# Patient Record
Sex: Female | Born: 1950 | Race: Black or African American | Hispanic: No | Smoking: Former smoker
Health system: Southern US, Community
[De-identification: ages and names within clinical notes are randomized; demographics above are authoritative.]

## PROBLEM LIST (undated history)

## (undated) DIAGNOSIS — Z9289 Personal history of other medical treatment: Secondary | ICD-10-CM

## (undated) DIAGNOSIS — Z5189 Encounter for other specified aftercare: Secondary | ICD-10-CM

## (undated) DIAGNOSIS — H269 Unspecified cataract: Secondary | ICD-10-CM

## (undated) DIAGNOSIS — M1990 Unspecified osteoarthritis, unspecified site: Secondary | ICD-10-CM

## (undated) DIAGNOSIS — Z87898 Personal history of other specified conditions: Secondary | ICD-10-CM

## (undated) DIAGNOSIS — D219 Benign neoplasm of connective and other soft tissue, unspecified: Secondary | ICD-10-CM

## (undated) DIAGNOSIS — Z9989 Dependence on other enabling machines and devices: Secondary | ICD-10-CM

## (undated) DIAGNOSIS — R011 Cardiac murmur, unspecified: Secondary | ICD-10-CM

## (undated) DIAGNOSIS — I10 Essential (primary) hypertension: Secondary | ICD-10-CM

## (undated) DIAGNOSIS — G4733 Obstructive sleep apnea (adult) (pediatric): Secondary | ICD-10-CM

## (undated) DIAGNOSIS — K5792 Diverticulitis of intestine, part unspecified, without perforation or abscess without bleeding: Secondary | ICD-10-CM

## (undated) DIAGNOSIS — K219 Gastro-esophageal reflux disease without esophagitis: Secondary | ICD-10-CM

## (undated) DIAGNOSIS — E785 Hyperlipidemia, unspecified: Secondary | ICD-10-CM

## (undated) HISTORY — DX: Dependence on other enabling machines and devices: Z99.89

## (undated) HISTORY — DX: Personal history of other specified conditions: Z87.898

## (undated) HISTORY — DX: Diverticulitis of intestine, part unspecified, without perforation or abscess without bleeding: K57.92

## (undated) HISTORY — PX: OOPHORECTOMY: SHX86

## (undated) HISTORY — DX: Encounter for other specified aftercare: Z51.89

## (undated) HISTORY — PX: JOINT REPLACEMENT: SHX530

## (undated) HISTORY — DX: Hyperlipidemia, unspecified: E78.5

## (undated) HISTORY — DX: Personal history of other medical treatment: Z92.89

## (undated) HISTORY — PX: BREAST SURGERY: SHX581

## (undated) HISTORY — DX: Benign neoplasm of connective and other soft tissue, unspecified: D21.9

## (undated) HISTORY — PX: HERNIA REPAIR: SHX51

## (undated) HISTORY — PX: TONSILLECTOMY: SHX5217

## (undated) HISTORY — DX: Essential (primary) hypertension: I10

## (undated) HISTORY — DX: Obstructive sleep apnea (adult) (pediatric): G47.33

## (undated) HISTORY — PX: EYE SURGERY: SHX253

## (undated) HISTORY — DX: Unspecified cataract: H26.9

---

## 1989-11-26 HISTORY — PX: ABDOMINAL HYSTERECTOMY: SHX81

## 1998-05-11 ENCOUNTER — Other Ambulatory Visit: Admission: RE | Admit: 1998-05-11 | Discharge: 1998-05-11 | Payer: Self-pay | Admitting: Obstetrics and Gynecology

## 1999-03-24 ENCOUNTER — Other Ambulatory Visit: Admission: RE | Admit: 1999-03-24 | Discharge: 1999-03-24 | Payer: Self-pay | Admitting: Obstetrics and Gynecology

## 1999-10-23 ENCOUNTER — Encounter (INDEPENDENT_AMBULATORY_CARE_PROVIDER_SITE_OTHER): Payer: Self-pay | Admitting: *Deleted

## 1999-10-23 ENCOUNTER — Ambulatory Visit (HOSPITAL_BASED_OUTPATIENT_CLINIC_OR_DEPARTMENT_OTHER): Admission: RE | Admit: 1999-10-23 | Discharge: 1999-10-24 | Payer: Self-pay | Admitting: Specialist

## 2000-04-18 ENCOUNTER — Other Ambulatory Visit: Admission: RE | Admit: 2000-04-18 | Discharge: 2000-04-18 | Payer: Self-pay | Admitting: Obstetrics and Gynecology

## 2001-03-15 ENCOUNTER — Encounter: Payer: Self-pay | Admitting: Emergency Medicine

## 2001-03-15 ENCOUNTER — Emergency Department (HOSPITAL_COMMUNITY): Admission: EM | Admit: 2001-03-15 | Discharge: 2001-03-15 | Payer: Self-pay | Admitting: Emergency Medicine

## 2001-04-23 ENCOUNTER — Other Ambulatory Visit: Admission: RE | Admit: 2001-04-23 | Discharge: 2001-04-23 | Payer: Self-pay | Admitting: Obstetrics and Gynecology

## 2002-03-06 ENCOUNTER — Other Ambulatory Visit: Admission: RE | Admit: 2002-03-06 | Discharge: 2002-03-06 | Payer: Self-pay | Admitting: Obstetrics and Gynecology

## 2003-03-08 ENCOUNTER — Other Ambulatory Visit: Admission: RE | Admit: 2003-03-08 | Discharge: 2003-03-08 | Payer: Self-pay | Admitting: Obstetrics and Gynecology

## 2004-03-20 ENCOUNTER — Other Ambulatory Visit: Admission: RE | Admit: 2004-03-20 | Discharge: 2004-03-20 | Payer: Self-pay | Admitting: Obstetrics and Gynecology

## 2005-01-09 ENCOUNTER — Ambulatory Visit: Payer: Self-pay | Admitting: Gastroenterology

## 2005-01-24 ENCOUNTER — Ambulatory Visit: Payer: Self-pay | Admitting: Gastroenterology

## 2005-02-12 ENCOUNTER — Ambulatory Visit: Payer: Self-pay | Admitting: Gastroenterology

## 2005-03-21 ENCOUNTER — Other Ambulatory Visit: Admission: RE | Admit: 2005-03-21 | Discharge: 2005-03-21 | Payer: Self-pay | Admitting: Obstetrics and Gynecology

## 2005-07-11 ENCOUNTER — Ambulatory Visit (HOSPITAL_COMMUNITY): Admission: RE | Admit: 2005-07-11 | Discharge: 2005-07-11 | Payer: Self-pay | Admitting: General Surgery

## 2005-07-11 ENCOUNTER — Encounter (INDEPENDENT_AMBULATORY_CARE_PROVIDER_SITE_OTHER): Payer: Self-pay | Admitting: *Deleted

## 2006-01-10 ENCOUNTER — Encounter: Admission: RE | Admit: 2006-01-10 | Discharge: 2006-01-10 | Payer: Self-pay | Admitting: General Surgery

## 2006-04-19 ENCOUNTER — Other Ambulatory Visit: Admission: RE | Admit: 2006-04-19 | Discharge: 2006-04-19 | Payer: Self-pay | Admitting: Obstetrics and Gynecology

## 2006-11-26 HISTORY — PX: REPLACEMENT TOTAL KNEE: SUR1224

## 2007-04-22 ENCOUNTER — Other Ambulatory Visit: Admission: RE | Admit: 2007-04-22 | Discharge: 2007-04-22 | Payer: Self-pay | Admitting: Obstetrics and Gynecology

## 2007-08-12 ENCOUNTER — Inpatient Hospital Stay (HOSPITAL_COMMUNITY): Admission: RE | Admit: 2007-08-12 | Discharge: 2007-08-15 | Payer: Self-pay | Admitting: Orthopedic Surgery

## 2008-04-28 ENCOUNTER — Other Ambulatory Visit: Admission: RE | Admit: 2008-04-28 | Discharge: 2008-04-28 | Payer: Self-pay | Admitting: Obstetrics and Gynecology

## 2008-08-05 ENCOUNTER — Ambulatory Visit (HOSPITAL_COMMUNITY): Admission: RE | Admit: 2008-08-05 | Discharge: 2008-08-05 | Payer: Self-pay | Admitting: Ophthalmology

## 2008-11-26 HISTORY — PX: EYE SURGERY: SHX253

## 2009-05-18 ENCOUNTER — Encounter: Payer: Self-pay | Admitting: Obstetrics and Gynecology

## 2009-05-18 ENCOUNTER — Other Ambulatory Visit: Admission: RE | Admit: 2009-05-18 | Discharge: 2009-05-18 | Payer: Self-pay | Admitting: Obstetrics and Gynecology

## 2009-05-18 ENCOUNTER — Ambulatory Visit: Payer: Self-pay | Admitting: Obstetrics and Gynecology

## 2010-07-21 ENCOUNTER — Other Ambulatory Visit: Admission: RE | Admit: 2010-07-21 | Discharge: 2010-07-21 | Payer: Self-pay | Admitting: Obstetrics and Gynecology

## 2010-07-21 ENCOUNTER — Ambulatory Visit: Payer: Self-pay | Admitting: Obstetrics and Gynecology

## 2010-08-22 ENCOUNTER — Ambulatory Visit: Payer: Self-pay | Admitting: Obstetrics and Gynecology

## 2010-09-19 ENCOUNTER — Ambulatory Visit: Payer: Self-pay | Admitting: Obstetrics and Gynecology

## 2010-11-23 ENCOUNTER — Ambulatory Visit (HOSPITAL_COMMUNITY)
Admission: RE | Admit: 2010-11-23 | Discharge: 2010-11-24 | Payer: Self-pay | Source: Home / Self Care | Attending: Ophthalmology | Admitting: Ophthalmology

## 2011-02-05 LAB — BASIC METABOLIC PANEL
BUN: 9 mg/dL (ref 6–23)
Calcium: 10.2 mg/dL (ref 8.4–10.5)
Creatinine, Ser: 0.91 mg/dL (ref 0.4–1.2)
GFR calc non Af Amer: >60 mL/min (ref >60)
Glucose, Bld: 100 mg/dL — ABNORMAL HIGH (ref 70–99)

## 2011-02-05 LAB — CBC
HCT: 41.1 % (ref 36.0–46.0)
MCHC: 31.6 g/dL (ref 30.0–36.0)
Platelets: 208 10*3/uL (ref 150–400)
RDW: 12.5 % (ref 11.5–15.5)

## 2011-02-05 LAB — SURGICAL PCR SCREEN
MRSA, PCR: NEGATIVE
Staphylococcus aureus: NEGATIVE

## 2011-02-27 NOTE — Op Note (Signed)
[name], BENNING NO.:  [id number]  MEDICAL RECORD NO.:  [id number]          PATIENT TYPE:  INP  LOCATION:  5508                         FACILITY:  [facility]  PHYSICIAN:  Chalmers Guest, M.D.     DATE OF BIRTH:  Apr 25, 1951  DATE OF PROCEDURE:  11/23/2010 DATE OF DISCHARGE:                              OPERATIVE REPORT   PREOPERATIVE DIAGNOSIS:  Uncontrolled primary open-angle glaucoma with preexisting scarring from previous glaucoma surgery.  PROCEDURE:  Trabeculectomy with mitomycin-C.  COMPLICATIONS:  None.  ANESTHESIA:  General.  PROCEDURE:  The patient was taken to the operating room where after induction of general anesthesia, the patient's face was prepped and draped in the usual sterile fashion.  Following this with the surgeon sitting superiorly, a 6-0 nylon suture was passed through clear cornea to infraduct the eye.  With the eye in intraductal position, the Hoskin was used to grasp the conjunctiva superotemporally because there was scarring at the 12 o'clock in nasal position.  A Westcott scissors were used to make an incision in the conjunctiva and dissection was carried out.  Following this using a blunt Westcott scissor, dissection was carried out posteriorly using blunt dissection.  Bleeding was controlled with cautery.  A tooke blade was used to recess Tenon fibers.  Following this, a 45-degree blade was used to fashion a half-thickness scleral flap.  A Colibri forceps and a #5700 blade was used to dissect up to the corneoscleral limbus with the limbal tissues identified.  Additional cautery was applied.  Following this, a Gelfoam sponge that had been presoaked on mitomycin-C 0.4 mg per mL was placed under the conjunctiva and allowed to stay on the eye for 4 minutes and then removed and irrigated with 60 mL of balanced salt solution.  At this point, a paracentesis was formed at the 7 o'clock position.  Then, the scleral flap was elevated and  a 45-degree blade was used to enter under the scleral flap into the anterior chamber.  A Descemet punch was used to remove a 1/3 block of tissue.  Bleeding was controlled with cautery.  A basilar peripheral iridectomy was performed and 2 interrupted 10-0 nylon sutures were placed.  BSS was injected through the paracentesis tract into the anterior chamber.  The chamber deepened and there was minimal flow around the scleral flap.  Therefore, the conjunctiva was sutured with a 9-0 nylon on a BV 100 needle at the limbus and secured in the center as well.  After tight closure was achieved, BSS was again injected in the anterior chamber.  The chamber deepened.  The bleb elevated.  The incision was Seidel negative.  A subconjunctival injection of Kenalog 4 mg was given in the inferotemporal quadrant. Following this, all instrumentations were removed from the eye.  The fixation suture was removed.  Topical atropine ointment and topical TobraDex ointment was applied to the eye, patch and Fox shield were placed, and the patient returned to the recovery area in stable condition.     Chalmers Guest, M.D.     RW/MEDQ  D:  11/23/2010  T:  11/24/2010  Job:  [id number]  Electronically  Signed by Chalmers Guest M.D. on 02/26/2011 01:43:08 PM

## 2011-04-10 NOTE — Op Note (Signed)
[name], [name]             ACCOUNT NO.:  [id number]   MEDICAL RECORD NO.:  [id number]          PATIENT TYPE:  AMB   LOCATION:  SDS                          FACILITY:  [facility]   PHYSICIAN:  Chalmers Guest, M.D.     DATE OF BIRTH:  01-04-1951   DATE OF PROCEDURE:  08/05/2008  DATE OF DISCHARGE:                               OPERATIVE REPORT   PREOPERATIVE DIAGNOSIS:  Uncontrolled primary open angle glaucoma, right  eye.   POSTOPERATIVE DIAGNOSIS:  Uncontrolled primary open angle glaucoma,  right eye.   PROCEDURE:  Optonol ExPRESS mini shunt with mitomycin C using a scleral  flap, right eye.   ANESTHESIA:  Consisted of 2% Xylocaine of 50:50 mixture of 0.75%  Marcaine with epinephrine with an ampule of Wydase.   PROCEDURE:  The patient was transferred to the operating room and under  monitored anesthesia, the patient was given a peribulbar block with the  aforementioned local anesthetic agent.  Following this, the patient's  face was prepped and draped in usual sterile fashion with the surgeon  sitting superiorly.  A 6-0 nylon suture was passed through the clear  cornea to infraduct the eye.  With the eye in the infraducted position,  a Hoskins forceps was used to grasp the iris in the superior nasal  quadrant and a Westcott scissors was used to incise the conjunctiva at  the limbus.  A blunt Westcott scissors was used to form a fornix-based  conjunctival flap with careful posterior dissection and posterior  spreading of this under the subconjunctival space with the scissors.  Following this, a Weck-cel was used to retract the conjunctiva and Tooke  blade was used to retract the Tenon tissue.  Bleeding was controlled  with cautery.  A 45-degree blade was used to fashion a half-thickness  scleral flap with a base of 4 mm.  A Colibri forceps was used and a  Grieshaber 5700 blade was used to dissect the scleral flap to the  corneoscleral limbus.  A Gelfoam soaked in mitomycin C 0.4  mg/mL was  placed under the conjunctiva and under the sclera and allowed to stay on  the eye for 2 minutes and then removed.  It was irrigated with 60 mL of  balanced salt solution.  Following this, a paracentesis was formed at  the 8:30 position and Provisc was injected in the eye.  The scleral flap  was then elevated and a 26 gauge needle was passed under the scleral  flap and into the anterior chamber at the corneal limbal junction.  The  needle was withdrawn and the shunt was then examined and loosened.  It  took some time to get the shunt loose from the apparatus.  It was a  preloaded ExPRESS shunt T50, model [id number].  The shunt was then  rotated and passed into the anterior chamber and locked into position.  Four interrupted 10-0 nylon sutures were placed to secure the scleral  flap.  The conjunctiva was then closed with a 9-0 nylon on a BV100  needle.  It was necessary to secure the conjunctival sutures at the  limbus.  BSS was injected to burp out the viscoelastic out of the  anterior chamber.  The bleb elevated.  An additional suture was placed  to achieve watertight closure.  Additional BSS was injected in the  anterior chamber and the incision was then Seidel negative.  A  subconjunctival  injection of Kenalog 4 mg was given at the 7 o'clock position.  Following this, topical TobraDex ointment was placed on the eye.  A  patch and Fox shield were placed and the patient returned to recovery  area in stable condition.  Complications were none.      Chalmers Guest, M.D.  Electronically Signed     RW/MEDQ  D:  08/05/2008  T:  08/05/2008  Job:  [id number]   cc:   Fax #:  [phone]

## 2011-04-10 NOTE — Op Note (Signed)
NAMEDARCELLA, SHIFFMAN             ACCOUNT NO.:  [id number]   MEDICAL RECORD NO.:  [id number]          PATIENT TYPE:  INP   LOCATION:  0006                         FACILITY:  [facility]   PHYSICIAN:  Madlyn Frankel. Charlann Boxer, M.D.  DATE OF BIRTH:  1951-07-29   DATE OF PROCEDURE:  08/12/2007  DATE OF DISCHARGE:                               OPERATIVE REPORT   PREOPERATIVE DIAGNOSIS:  Right knee end-stage osteoarthritis.   POSTOPERATIVE DIAGNOSIS:  Right knee end-stage osteoarthritis.   PROCEDURE PERFORMED:  Right total knee replacement.   COMPONENTS USED:  DuPuy knee system, rotating platform, posterior  stabilized, with size 2-1/2 femur, 2-1/2 tibia, 15-mm insert, and a 35  patellar button.   SURGEON:  Madlyn Frankel. Charlann Boxer, M.D.   ASSISTANT:  [name]. Loreta Ave, PA-C.   ANESTHESIA:  Spinal.   DRAINS:  x1.   TOURNIQUET TIME:  41 minutes at 250 mmHg.   COMPLICATIONS:  None.   INDICATION FOR PROCEDURE:  The patient is a 60 year old female, with end-  stage right knee osteoarthritis.  She had failed conservative measures  with injections and viscous supplementation.  At this point, at  explanation of the possible complications and explanation of the  procedure above, the patient wishes to proceed with surgical  intervention.  We discussed the benefits of pain relief and improved  quality of life versus the risk of infection, deep venous thrombosis,  component failure, need for revision, as well as the extensive  postoperative therapy which is required.  Consent was obtained.   PROCEDURE IN DETAIL:  The patient was brought to the operative theater.  Once adequate anesthesia and preoperative antibiotics, Ancef,  administered, the patient was positioned supine.  The right proximal  thigh tourniquet was placed.  The right leg was then pre-scrubbed and  then prepped and draped in sterile fashion.  A midline incision was  made, followed by a modified medial parapatellar arthrotomy with no  subluxation.  Next, following initial exposure, including synovectomy  due to the large synovial response, a patellar cut was made.  Pre-cut  measurements of 22 mm and now was taken down to 14 mm and using 35  patellar button, restoring height to 23 mm to 24 mm.  Attention was now  directed to the femur.  The canal was opened and irrigated, inside  emboli.  Intramedullary rod was packed, with 5 degrees of valgus on the  right knee I resected 10 mm of plantar to distal femur and then sized  the distal femur to be a size 2.5, and, based on the fact that she had  more lateral disease, she had a rather hyperplastic femoral condyle.  A  distal cut was made.   Unfortunately, with the hyperplastic lateral femur, there was some  neutral slight internal rotation of her femoral component.  I did on the  lateral side drop down my pinhole to make the cutting block  perpendicular to the Whiteside's lines and more equal to the epicondylar  line.  The anterior, posterior, and chamfer cuts were then made without  complication of notching.  We then finished off the femoral preparation  with box cut based on the lateral aspect of the distal femur.   The patient was then, the attention was now directed to the tibia.  Subluxation was carried out using the extramedullary device and I  resected 8 mm of bone out from the lateral side of the space and the  fact that the tibia appeared to be neutral and an axis of 6-mm cut  medially, and this cut was made and debridement was carried out as  necessary, including further meniscectomy with instillation of it around  the posterior cruciate ligament.  Extension block came out to at least  full extension of the 10-mm block and was plugged in the knee  symptomatic at 12.5 to 15.  At this point, I went ahead and finished off  the tibia and placed it size 2 tibial tray on the cut surface, passing  alignment rod after the cut was perpendicular and by planes.  I then   drilled and keel punched.  A trial reduction was carried out with a 2.5  femur and 2.5 tibia, the first a 12.5 and then a 15-mm poly.  The knee  came to full extension and was stable from extension to flexion.  At  this point, and the patella tracked without application of knee pressure  and into the trochlea.   At this point, all trial components were removed.  The knee was  copiously irrigated with normal saline solution.  The knee was also  injected in the synovial layer with 0.25% Marcaine and epinephrine with  160 of Toradol.   At this point, the cement was mixed.  The final components were then  cemented in position.  He had the knee was going out to extension.  The  protruding cement was removed.  Once the patient continued to cure, the  knee was irrigated again, I placed the final 15 x 2.5 poly.  At this  point, the knee was again irrigated.  A medium Hemovac drain was placed.  The knee was brought to flexion and the knee immediately was seen to be  reapproximated with extension nicely.  The remainder was closed with 2-0  Vicryl and running 4-0 Monocryl.  The patient's knee was then cleaned  and dried and dressed sterilely with Steri-Strips.  The wound had a  bulky sterile wrap.  She was then brought to the recovery room in stable  condition.      Madlyn Frankel Charlann Boxer, M.D.  Electronically Signed     MDO/MEDQ  D:  08/12/2007  T:  08/12/2007  Job:  [id number]

## 2011-04-13 NOTE — Op Note (Signed)
NAMECORTNE, [name]             ACCOUNT NO.:  [id number]   MEDICAL RECORD NO.:  [id number]          PATIENT TYPE:  AMB   LOCATION:  DAY                          FACILITY:  [facility]   PHYSICIAN:  Leonie Man, M.D.   DATE OF BIRTH:  1951-08-03   DATE OF PROCEDURE:  07/11/2005  DATE OF DISCHARGE:                                 OPERATIVE REPORT   PREOPERATIVE DIAGNOSIS:  Duct papillomatosis right breast.   POSTOPERATIVE DIAGNOSIS:  Duct papillomatosis right breast.   PROCEDURES:  Right breast major duct excision.   SURGEON:  Leonie Man, M.D.   ASSISTANT:  OR tech.   ANESTHESIA:  General.   SPECIMENS TO THE PATHOLOGY LAB:  Duct system of the right breast.   ESTIMATED BLOOD LOSS:  Minimal.   COMPLICATIONS:  No complications.  The patient to the PACU in good  condition.   HISTORY:  Note, the patient is a 60 year old woman presenting with a right  bloody nipple discharge. She underwent ductogram which showed filling  defects in several of the ducts under the areolar of the right breast. On  expression of the area in the upper inner quadrant of the breast, there is a  copious amount of brownish fluid that is removed. The patient comes to the  operating room after the details of the procedure, the risks and potential  benefits of surgery have been discussed, all questions answered and consent  obtained. It should be noted that this patient has undergone reduction  mammoplasties in the past and the possibility of some skin slough following  duct excision was discussed in detail with the patient.   DESCRIPTION OF PROCEDURE:  Following the induction of satisfactory general  anesthesia, the patient is positioned supinely and the right breast is  prepped and draped to be included in the sterile operative field. I made a  circumareolar incision extending on the medial aspect of the areolar border  extending around approximately 180 degrees of the areola. The areolar skin  was  then elevated as a flap and dissection carried down taking the entire  duct system from the region underneath the nipple. The duct system was then  excised for a distance of 5 cm deep into the breast tissues and this was  removed in its entirety and forwarded for pathologic evaluation. Hemostasis  was obtained with electrocautery, interrupted 2-0 Vicryl sutures were used  to reapproximate the breast tissue. 3-0 Vicryl was used to close the  subcutaneous tissue after all sponge, instrument and sharp counts were  verified. The skin was then  closed with running 5-0 Monocryl suture and then reinforced with Steri-  Strips, sterile dressings were applied, the anesthetic reversed and the  patient removed from the operating room to the recovery room in stable  condition. She tolerated the procedure well.      Leonie Man, M.D.  Electronically Signed     PB/MEDQ  D:  07/11/2005  T:  07/12/2005  Job:  [id number]

## 2011-04-13 NOTE — H&P (Signed)
[name], [name]             ACCOUNT NO.:  [id number]   MEDICAL RECORD NO.:  [id number]        PATIENT TYPE:  LINP   LOCATION:                               FACILITY:  [facility]   PHYSICIAN:  Madlyn Frankel. Charlann Boxer, M.D.  DATE OF BIRTH:  January 03, 1951   DATE OF ADMISSION:  08/12/2007  DATE OF DISCHARGE:                              HISTORY & PHYSICAL   PROCEDURE:  Right total knee arthroplasty.   CHIEF COMPLAINT:  Right knee pain.   HISTORY OF PRESENT ILLNESS:  This is a 60 year old female with a history  persistent progressive right knee pain secondary to osteoarthritis.  It  has been refractory to all conservative treatments included anti-  inflammatories and injections.  She has been presurgically assessed by  Talmadge Coventry, M.D.   PAST MEDICAL HISTORY:  Significant for  1. Osteoarthritis.  2. Hypertension.   PAST SURGICAL HISTORY:  1. Hysterectomy.  2. Breast reduction.   FAMILY HISTORY:  Hypertension, diabetes, cancer, stroke, arthritis.   SOCIAL HISTORY:  She is widowed.  Primary caregiver will be her cousin,  Charma Igo, after surgery.   DRUG ALLERGIES:  NO KNOWN DRUG ALLERGIES.   MEDICATIONS:  1. Hydrochlorothiazide 25 mg half tablet daily.  2. Estradiol 0.5 mg p.o. daily.  3. Vitamin E.  4. Multivitamin.  5. Garlic pill.  6. Over-the-counter eye drops.  7. Celebrex 200 mg p.o. b.i.d. x2 days after surgery.   REVIEW OF SYSTEMS:  GASTROINTESTINAL:  History of hiatal hernia with  some reflux issues.  Otherwise see HPI.   PHYSICAL EXAMINATION:  VITAL SIGNS:  Pulse 64, respirations 18, blood  pressure 124/74.  GENERAL:  Awake, alert and oriented, well-developed, well-nourished, no  acute distress.  NECK:  Supple.  No carotid bruits.  CHEST/LUNGS:  Clear to auscultation bilaterally.  BREASTS:  Deferred.  HEART:  Regular rate and rhythm without gallops, clicks, rubs or  murmurs.  ABDOMEN:  Soft, nontender, nondistended.  Bowel sounds present.  GENITOURINARY:   Deferred.  EXTREMITIES:  She has valgus deformity.  She comes out to full extension  with the 130 degrees of flexion.  SKIN:  Intact.  No cellulitis.  No infection.  NEUROLOGIC:  Intact distal sensibilities.   Labs, EKG, chest x-ray all pending her assessment.   IMPRESSION:  1. Right knee osteoarthritis.  2. Hypertension.   PLAN OF ACTION:  Right total knee arthroplasty August 12, 2007, at  [facility] by Dr. Charlann Boxer.  Risks and complications were  discussed.  Questions were encouraged, answered and reviewed.   POSTOPERATIVE MEDICATIONS:  Lovenox, Robaxin, iron, aspirin, Colace and  MiraLax provided.  Pain medications will be provided at time of surgery.     ______________________________  [name] Loreta Ave, [place]      Madlyn Frankel. Charlann Boxer, M.D.  Electronically Signed    BLM/MEDQ  D:  07/25/2007  T:  07/26/2007  Job:  [id number]   cc:   Talmadge Coventry, M.D.  Fax: [phone]

## 2011-04-13 NOTE — Discharge Summary (Signed)
[name], [name]             ACCOUNT NO.:  [id number]   MEDICAL RECORD NO.:  [id number]          PATIENT TYPE:  INP   LOCATION:  1608                         [facility]ACILITY:  [facility]   PHYSICIAN:  Madlyn Frankel. Charlann Boxer, M.D.  DATE OF BIRTH:  23-Dec-1950   DATE OF ADMISSION:  08/12/2007  DATE OF DISCHARGE:  08/15/2007                               DISCHARGE SUMMARY   ADMITTING DIAGNOSES:  1. Osteoarthritis.  2. Hypertension.  3. Obstructive sleep apnea.  4. Esophageal reflux.  5. Glaucoma.   DISCHARGE DIAGNOSES:  1. Osteoarthritis.  2. Hypertension.  3. Obstructive sleep apnea.  4. Reflux disease.  5. Glaucoma.   BRIEF HISTORY OF PRESENT ILLNESS:  A 60 year old female with history of  persistent progressive right knee pain secondary to osteoarthritis  refractory to all conservative treatments.  Presurgically assessed by  Talmadge Coventry, M.D..   LABORATORY DATA:  Preadmission CBC normal at discharge.  Hematocrit 30.3  and stable. Coagulation normal.  Routine chemistries no abnormalities,  all stable at discharge.  Kidney function normal at time of discharge.  On admission she was little bit dehydrated with a GFR of 58.  Calcium  was 8.9 at discharge.  Ranging chemistry GI normal.  Urinalysis  negative.   RADIOLOGY:  Chest  2 views, no active cardiopulmonary disease.  EKG:  Normal sinus rhythm.   HOSPITAL COURSE:  The patient underwent right total knee replacement,  tolerated the procedure well and was admitted to the orthopedic floor.  She remained afebrile throughout her course today.  She remained  neurovascularly intact of right lower extremity throughout her course  today.  Dressing was changed after postop day #1 on a daily basis with  no significant drains from her wounds.  DVT prophylaxis was started on  postop day #1 as well as PT, OT, weight bearing as tolerated.  Anticipated discharged, was discharged home with home health care PT.  She worked on her straight leg  raising by the time of discharge.  On day  3 she was able to perform a straight leg raise.  She was stable.  Dressing was changed once again.  The wound was checked with no  significant events and was stable.   DISCHARGE DISPOSITION:  Discharged home with home health care PT, in  stable and improved condition.   DISCHARGE DIET:  No restrictions.   DISCHARGE WOUND CARE:  Keep dry.   FOLLOW UP:  Follow up with Dr. Barrie Folk 04:39:00 in 2 weeks.   DISCHARGE MEDICATIONS:  1. Hydrochlorothiazide 25 mg 0.5 tab every a.m..  2. Estradiol 0.5 mg 1 by mouth every day a.m..  3. Azopt both eyes every a.m., every p.m..  4. Lumigan 1 drop in both eyes daily.  5. Vicodin 5/325, 1-2 by mouth every 4-6 hours as needed for pain.  6. Lovenox 30 mg subcutaneous every 12 x 11 days.  7. Robaxin 500 mg by mouth ever 6 as needed muscle spasm pain.  8. Iron 325 mg by mouth every t.i.d. x 2 weeks.  9. Enteric coated aspirin 325 mg 1 by mouth daily x 4 weeks after  Lovenox completed.  10.Colace 100 mg by mouth b.i.d..  11.MiraLax 70 gm by mouth daily.     ______________________________  [name] Loreta Ave, [place]      Madlyn Frankel. Charlann Boxer, M.D.  Electronically Signed    BLM/MEDQ  D:  08/26/2007  T:  08/26/2007  Job:  [id number]

## 2011-06-25 ENCOUNTER — Other Ambulatory Visit: Payer: Self-pay | Admitting: Internal Medicine

## 2011-06-25 DIAGNOSIS — R3989 Other symptoms and signs involving the genitourinary system: Secondary | ICD-10-CM

## 2011-06-25 DIAGNOSIS — R109 Unspecified abdominal pain: Secondary | ICD-10-CM

## 2011-06-29 ENCOUNTER — Ambulatory Visit
Admission: RE | Admit: 2011-06-29 | Discharge: 2011-06-29 | Disposition: A | Discharge status: Home / Self Care | Payer: BC Managed Care – PPO | Source: Ambulatory Visit | Attending: Internal Medicine | Admitting: Internal Medicine

## 2011-06-29 DIAGNOSIS — R3989 Other symptoms and signs involving the genitourinary system: Secondary | ICD-10-CM

## 2011-06-29 DIAGNOSIS — R109 Unspecified abdominal pain: Secondary | ICD-10-CM

## 2011-07-24 DIAGNOSIS — D219 Benign neoplasm of connective and other soft tissue, unspecified: Secondary | ICD-10-CM | POA: Insufficient documentation

## 2011-07-24 DIAGNOSIS — H409 Unspecified glaucoma: Secondary | ICD-10-CM | POA: Insufficient documentation

## 2011-08-02 ENCOUNTER — Encounter: Payer: BC Managed Care – PPO | Admitting: Obstetrics and Gynecology

## 2011-08-26 ENCOUNTER — Other Ambulatory Visit: Payer: Self-pay | Admitting: Obstetrics and Gynecology

## 2011-08-29 LAB — CBC
MCHC: 33.9
MCV: 97.5
Platelets: 200
WBC: 7.9

## 2011-08-29 LAB — BASIC METABOLIC PANEL
BUN: 10
Calcium: 9.4
Chloride: 107
Creatinine, Ser: 0.85

## 2011-09-06 ENCOUNTER — Other Ambulatory Visit (HOSPITAL_COMMUNITY)
Admission: RE | Admit: 2011-09-06 | Discharge: 2011-09-06 | Disposition: A | Discharge status: Home / Self Care | Payer: BC Managed Care – PPO | Source: Ambulatory Visit | Attending: Obstetrics and Gynecology | Admitting: Obstetrics and Gynecology

## 2011-09-06 ENCOUNTER — Ambulatory Visit (INDEPENDENT_AMBULATORY_CARE_PROVIDER_SITE_OTHER): Payer: BC Managed Care – PPO | Admitting: Obstetrics and Gynecology

## 2011-09-06 ENCOUNTER — Encounter: Payer: Self-pay | Admitting: Obstetrics and Gynecology

## 2011-09-06 VITALS — BP 130/74 | Ht 62.5 in | Wt 228.0 lb

## 2011-09-06 DIAGNOSIS — N951 Menopausal and female climacteric states: Secondary | ICD-10-CM

## 2011-09-06 DIAGNOSIS — Z01419 Encounter for gynecological examination (general) (routine) without abnormal findings: Secondary | ICD-10-CM

## 2011-09-06 DIAGNOSIS — Z780 Asymptomatic menopausal state: Secondary | ICD-10-CM

## 2011-09-06 LAB — TYPE AND SCREEN: ABO/RH(D): O NEG

## 2011-09-06 LAB — CBC
MCHC: 33.9
MCV: 87.4
Platelets: 186
RBC: 3.47 — ABNORMAL LOW
WBC: 8.1

## 2011-09-06 LAB — BASIC METABOLIC PANEL
BUN: 8
CO2: 30
Calcium: 8.9
Chloride: 103
Creatinine, Ser: 0.91
Creatinine, Ser: 0.97
GFR calc Af Amer: >60

## 2011-09-06 LAB — ABO/RH: ABO/RH(D): O NEG

## 2011-09-06 NOTE — Progress Notes (Signed)
Patient came back to see me today for an annual GYN exam. She is up-to-date both on mammograms and bone densities. She is doing estradiol half a milligram for vasomotor symptoms with excellent results. We have previously tried her on patches to reduce DVT risk but she says they won't stay on in she's tried a variety of them. She is having no pelvic pain or vaginal bleeding.  12 point review of systems done and only pertinent positive is glaucoma.   HEENT: Within normal limits. Neck: No masses. Supraclavicular lymph nodes: Not enlarged. Breasts: Examined in both sitting and lying position. Symmetrical without skin changes or masses. Abdomen: Soft no masses guarding or rebound. No hernias. Pelvic: External within normal limits. BUS within normal limits. Vaginal examination shows good estrogen effect, no cystocele enterocele or rectocele. Cervix and uterus absent. Adnexa within normal limits. Rectovaginal confirmatory. Extremities within normal limits.   Assessment: Menopausal symptoms  Plan: Patient will try  Elestrin one spray daily. Samples given. She will check on cost. She will inform whether she wants to stay with the Elestrin or go back to oral estradiol.

## 2011-09-07 LAB — PROTIME-INR
INR: 0.9
Prothrombin Time: 12.6

## 2011-09-07 LAB — CBC
HCT: 39.7
Hemoglobin: 13.4
Platelets: 237
WBC: 4.8

## 2011-09-07 LAB — COMPREHENSIVE METABOLIC PANEL
ALT: 24
Albumin: 3.7
Alkaline Phosphatase: 75
BUN: 19
Chloride: 103
Potassium: 4
Total Bilirubin: 0.9

## 2011-09-07 LAB — URINALYSIS, ROUTINE W REFLEX MICROSCOPIC
Glucose, UA: NEGATIVE
Protein, ur: NEGATIVE
pH: 6

## 2011-10-16 DIAGNOSIS — Z9289 Personal history of other medical treatment: Secondary | ICD-10-CM

## 2011-10-16 HISTORY — DX: Personal history of other medical treatment: Z92.89

## 2012-01-27 ENCOUNTER — Emergency Department (HOSPITAL_COMMUNITY)
Admission: EM | Admit: 2012-01-27 | Discharge: 2012-01-27 | Disposition: A | Discharge status: Home / Self Care | Payer: BC Managed Care – PPO | Attending: Emergency Medicine | Admitting: Emergency Medicine

## 2012-01-27 ENCOUNTER — Emergency Department (HOSPITAL_COMMUNITY): Payer: BC Managed Care – PPO

## 2012-01-27 ENCOUNTER — Encounter (HOSPITAL_COMMUNITY): Payer: Self-pay | Admitting: Emergency Medicine

## 2012-01-27 DIAGNOSIS — R062 Wheezing: Secondary | ICD-10-CM | POA: Insufficient documentation

## 2012-01-27 DIAGNOSIS — R05 Cough: Secondary | ICD-10-CM | POA: Insufficient documentation

## 2012-01-27 DIAGNOSIS — J209 Acute bronchitis, unspecified: Secondary | ICD-10-CM

## 2012-01-27 DIAGNOSIS — R059 Cough, unspecified: Secondary | ICD-10-CM | POA: Insufficient documentation

## 2012-01-27 DIAGNOSIS — R51 Headache: Secondary | ICD-10-CM | POA: Insufficient documentation

## 2012-01-27 MED ORDER — IPRATROPIUM BROMIDE 0.02 % IN SOLN
0.5000 mg | Freq: Once | RESPIRATORY_TRACT | Status: AC
  Administered 2012-01-27: 0.5 mg via RESPIRATORY_TRACT
  Filled 2012-01-27: qty 2.5

## 2012-01-27 MED ORDER — ALBUTEROL SULFATE HFA 108 (90 BASE) MCG/ACT IN AERS
2.0000 | INHALATION_SPRAY | RESPIRATORY_TRACT | Status: DC | PRN
  Administered 2012-01-27: 2 via RESPIRATORY_TRACT
  Filled 2012-01-27: qty 6.7

## 2012-01-27 MED ORDER — AZITHROMYCIN 250 MG PO TABS: ORAL_TABLET | ORAL | Status: AC

## 2012-01-27 MED ORDER — PREDNISONE 20 MG PO TABS
60.0000 mg | ORAL_TABLET | ORAL | Status: AC
  Administered 2012-01-27: 60 mg via ORAL
  Filled 2012-01-27: qty 3

## 2012-01-27 MED ORDER — ALBUTEROL SULFATE (5 MG/ML) 0.5% IN NEBU
5.0000 mg | INHALATION_SOLUTION | Freq: Once | RESPIRATORY_TRACT | Status: AC
  Administered 2012-01-27: 5 mg via RESPIRATORY_TRACT
  Filled 2012-01-27: qty 1

## 2012-01-27 MED ORDER — DM-GUAIFENESIN ER 30-600 MG PO TB12: 1.0000 | ORAL_TABLET | Freq: Two times a day (BID) | ORAL | Status: AC

## 2012-01-27 MED ORDER — PREDNISONE 20 MG PO TABS: ORAL_TABLET | ORAL | Status: DC

## 2012-01-27 MED ORDER — OPTICHAMBER ADVANTAGE MISC
1.0000 | Freq: Once | Status: AC
  Administered 2012-01-27: 1
  Filled 2012-01-27: qty 1

## 2012-01-27 NOTE — ED Notes (Signed)
Assumed care of patient, resting quietly in bed. Pt states she feels better. Resp even and unlabored. No acute distress.

## 2012-01-27 NOTE — ED Notes (Signed)
Verbalized understanding of discharge instructions. No acute distress. 

## 2012-01-27 NOTE — ED Provider Notes (Signed)
History     CSN: [id number]  Arrival date & time 01/27/12  0135   First MD Initiated Contact with Patient 01/27/12 0256      Chief Complaint  Patient presents with  . Cough    x one week  . Nasal Congestion    x one week  . Headache    (Consider location/radiation/quality/duration/timing/severity/associated sxs/prior treatment) HPI  Patient relates for the past week she's had chest congestion. She states she took some leftover Tessalon Perles. She states tonight when she laid down she felt a rattling in her chest and she was jumping in her sleep. She states she's had rhinorrhea that is clear mixed with white after using her 90 pot patient also states she's had a cough with green in yellow mucus. She states she gets a headache when she coughs. She states she had chills last night but did not check her temperature. She has a scratchy throat and she denies nausea, vomiting or diarrhea. She has a history of glaucoma and has not had a change in her vision.  PCP Dr. Velna Hatchet  Past Medical History  Diagnosis Date  . Fibroid   . Glaucoma     Past Surgical History  Procedure Date  . Abdominal hysterectomy 1991    TAH,BSO  . Hernia repair   . Breast surgery     REDUCTION MAMMAPLASTY  . Tonsillectomy   . Replacement total knee 2008  . Eye surgery 2010  . Oophorectomy     BSO  . Eye surgery     shunt due to glaucoma    Family History  Problem Relation Age of Onset  . Cancer Mother     Bladder cancer  . Hypertension Sister   . Breast cancer Sister     BREAST AND BLADDER  . Cancer Sister     Bladder cancer  . Hypertension Brother   . COPD Brother     History  Substance Use Topics  . Smoking status: Former Games developer  . Smokeless tobacco: Not on file  . Alcohol Use: No   employed No one smokes at home or at work  OB History    Grav Para Term Preterm Abortions TAB SAB Ect Mult Living   0               Review of Systems  All other systems reviewed and are  negative.    Allergies  Aspirin and Sulfa antibiotics  Home Medications   Current Outpatient Rx  Name Route Sig Dispense Refill  . BIMATOPROST 0.03 % OP SOLN Left Eye Place 1 drop into the left eye at bedtime.    Marland Kitchen BRIMONIDINE TARTRATE 0.1 % OP SOLN Left Eye Place 1 drop into the left eye 3 (three) times daily.    Marland Kitchen VITAMIN D 1000 UNITS PO TABS Oral Take 1,000 Units by mouth daily.    Marland Kitchen ESTRADIOL 0.5 MG PO TABS  TAKE 1 TABLET BY MOUTH EVERY DAY 90 tablet 2  . OMEGA-3 FATTY ACIDS 1000 MG PO CAPS Oral Take 2 g by mouth daily.    Marland Kitchen GARLIC PO TABS Oral Take 1 tablet by mouth daily.    Marland Kitchen HYDROCHLOROTHIAZIDE 12.5 MG PO CAPS Oral Take 25 mg by mouth daily.     Marland Kitchen METHAZOLAMIDE 50 MG PO TABS Oral Take 100 mg by mouth 2 (two) times daily.    Marland Kitchen PREDNISOLONE ACETATE 1 % OP SUSP Right Eye Place 1 drop into the right eye daily.    Marland Kitchen  TIMOLOL MALEATE 0.5 % (DAILY) OP SOLN Ophthalmic Apply 1 drop to eye daily. In left eye    . VITAMIN E 1000 UNITS PO CAPS Oral Take 1,000 Units by mouth daily.    . OMEGA 3 PO Oral Take by mouth.        BP 147/83  Pulse 84  Temp(Src) 98.5 F (36.9 C) (Oral)  Resp 17  Ht 5\' 2"  (1.575 m)  Wt 219 lb 2.2 oz (99.4 kg)  BMI 40.08 kg/m2  SpO2 98%  Vital signs normal    Physical Exam  Nursing note and vitals reviewed. Constitutional: She is oriented to person, place, and time. She appears well-developed and well-nourished.  Non-toxic appearance. She does not appear ill. No distress.  HENT:  Head: Normocephalic and atraumatic.  Right Ear: External ear normal.  Left Ear: External ear normal.  Nose: Nose normal. No mucosal edema or rhinorrhea.  Mouth/Throat: Oropharynx is clear and moist and mucous membranes are normal. No dental abscesses or uvula swelling.  Eyes: Conjunctivae and EOM are normal. Pupils are equal, round, and reactive to light.       Cornea not hazy  Neck: Normal range of motion and full passive range of motion without pain. Neck supple.    Cardiovascular: Normal rate, regular rhythm and normal heart sounds.  Exam reveals no gallop and no friction rub.   No murmur heard. Pulmonary/Chest: Effort normal. No respiratory distress. She has wheezes. She has no rhonchi. She has no rales. She exhibits no tenderness and no crepitus.       Patient has diffuse expiratory wheezes  Abdominal: Soft. Normal appearance and bowel sounds are normal. She exhibits no distension. There is no tenderness. There is no rebound and no guarding.  Musculoskeletal: Normal range of motion. She exhibits no edema and no tenderness.       Moves all extremities well.   Neurological: She is alert and oriented to person, place, and time. She has normal strength. No cranial nerve deficit.  Skin: Skin is warm, dry and intact. No rash noted. No erythema. No pallor.  Psychiatric: Her speech is normal and behavior is normal. Her mood appears not anxious.       Affect flat    ED Course  Procedures (including critical care time)  Pt given nebulizer with albuterol/atrovent.  Recheck at 04 55 shows improved air movement, she now has lower pitched expiratory wheezing. She received a second albuterol/Atrovent nebulizer.  Recheck at 06 40 patient continues to have some low pitched end expiratory wheeze. She was given a third albuterol/Atrovent nebulizer and started on oral prednisone.  Pt given albuterol inhaler and shown how to use it with a spacer since she has never used one before.    Dg Chest 2 View  01/27/2012  *RADIOLOGY REPORT*  Clinical Data: Cough and wheezing.  CHEST - 2 VIEW  Comparison: Chest radiograph performed 11/21/2010  Findings: The lungs are well-aerated and clear.  There is no evidence of focal opacification, pleural effusion or pneumothorax.  The heart is normal in size; the mediastinal contour is within normal limits.  No acute osseous abnormalities are seen.  IMPRESSION: No acute cardiopulmonary process seen.  Original Report Authenticated By:  Tonia Ghent, M.D.     1. Bronchitis with bronchospasm     Patient's Medications  New Prescriptions   AZITHROMYCIN (ZITHROMAX Z-PAK) 250 MG TABLET    Take 2 po the first day then once a day for the next 4 days.   DEXTROMETHORPHAN-GUAIFENESIN (  MUCINEX DM) 30-600 MG PER 12 HR TABLET    Take 1 tablet by mouth every 12 (twelve) hours.   PREDNISONE (DELTASONE) 20 MG TABLET    Take 3 po QD x 2d starting tomorrow, then 2 po QD x 3d then 1 po QD x 3d   Plan discharge Devoria Albe, MD, FACEP   MDM          Ward Givens, MD 01/27/12 [phone]

## 2012-01-27 NOTE — ED Notes (Signed)
  Pt reports "I've been coughing for a week" Now feels "real congested right in the middle of my chest, it sounds like I'm snoring when I lay down."  Pt in no apparent distress. Denies chest pain or shortness of breath, but reports "I couldn't sleep"; Denies body aches but reports chills.

## 2012-04-07 ENCOUNTER — Emergency Department (HOSPITAL_COMMUNITY): Payer: BC Managed Care – PPO

## 2012-04-07 ENCOUNTER — Encounter (HOSPITAL_COMMUNITY): Payer: Self-pay | Admitting: Emergency Medicine

## 2012-04-07 ENCOUNTER — Emergency Department (HOSPITAL_COMMUNITY)
Admission: EM | Admit: 2012-04-07 | Discharge: 2012-04-08 | Disposition: A | Discharge status: Home / Self Care | Payer: BC Managed Care – PPO | Attending: Emergency Medicine | Admitting: Emergency Medicine

## 2012-04-07 DIAGNOSIS — Z79899 Other long term (current) drug therapy: Secondary | ICD-10-CM | POA: Insufficient documentation

## 2012-04-07 DIAGNOSIS — R5383 Other fatigue: Secondary | ICD-10-CM | POA: Insufficient documentation

## 2012-04-07 DIAGNOSIS — R509 Fever, unspecified: Secondary | ICD-10-CM | POA: Insufficient documentation

## 2012-04-07 DIAGNOSIS — J189 Pneumonia, unspecified organism: Secondary | ICD-10-CM | POA: Insufficient documentation

## 2012-04-07 DIAGNOSIS — R5381 Other malaise: Secondary | ICD-10-CM | POA: Insufficient documentation

## 2012-04-07 DIAGNOSIS — R110 Nausea: Secondary | ICD-10-CM | POA: Insufficient documentation

## 2012-04-07 DIAGNOSIS — J3489 Other specified disorders of nose and nasal sinuses: Secondary | ICD-10-CM | POA: Insufficient documentation

## 2012-04-07 DIAGNOSIS — R059 Cough, unspecified: Secondary | ICD-10-CM | POA: Insufficient documentation

## 2012-04-07 DIAGNOSIS — IMO0001 Reserved for inherently not codable concepts without codable children: Secondary | ICD-10-CM | POA: Insufficient documentation

## 2012-04-07 DIAGNOSIS — H409 Unspecified glaucoma: Secondary | ICD-10-CM | POA: Insufficient documentation

## 2012-04-07 DIAGNOSIS — R05 Cough: Secondary | ICD-10-CM | POA: Insufficient documentation

## 2012-04-07 MED ORDER — ACETAMINOPHEN 325 MG PO TABS
650.0000 mg | ORAL_TABLET | Freq: Once | ORAL | Status: AC
  Administered 2012-04-07: 650 mg via ORAL
  Filled 2012-04-07: qty 2

## 2012-04-07 NOTE — ED Notes (Signed)
Pt sts she has been coughing, feverish with chills and with generalized body aches since Friday. Pt hot to touch.

## 2012-04-07 NOTE — ED Notes (Signed)
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

## 2012-04-08 LAB — URINALYSIS, ROUTINE W REFLEX MICROSCOPIC
Glucose, UA: NEGATIVE mg/dL
Hgb urine dipstick: NEGATIVE
Protein, ur: 30 mg/dL — AB

## 2012-04-08 MED ORDER — AZITHROMYCIN 250 MG PO TABS: ORAL_TABLET | ORAL | Status: DC

## 2012-04-08 MED ORDER — IBUPROFEN 600 MG PO TABS: 600.0000 mg | ORAL_TABLET | Freq: Four times a day (QID) | ORAL | Status: AC | PRN

## 2012-04-08 MED ORDER — AZITHROMYCIN 250 MG PO TABS
500.0000 mg | ORAL_TABLET | Freq: Once | ORAL | Status: AC
  Administered 2012-04-08: 500 mg via ORAL
  Filled 2012-04-08: qty 2

## 2012-04-08 NOTE — ED Provider Notes (Signed)
Medical screening examination/treatment/procedure(s) were performed by non-physician practitioner and as supervising physician I was immediately available for consultation/collaboration.     D , MD 04/08/12 1424 

## 2012-04-08 NOTE — ED Provider Notes (Signed)
History     CSN: [id number]  Arrival date & time 04/07/12  1941   First MD Initiated Contact with Patient 04/07/12 2231      Chief Complaint  Patient presents with  . Fever  . Generalized Body Aches    (Consider location/radiation/quality/duration/timing/severity/associated sxs/prior treatment) Patient is a 61 y.o. female presenting with fever. The history is provided by the patient.  Fever Primary symptoms of the febrile illness include fever, cough, nausea and myalgias. Primary symptoms do not include headaches, wheezing, shortness of breath, abdominal pain, vomiting, dysuria or rash. The current episode started 3 to 5 days ago. This is a new problem. The problem has been gradually worsening.  The myalgias are associated with weakness.   Pt states she is having generalized body aches, cough, nasal congestion for last 4 days. States yesterday went to urgent care, they started her on amoxil for an ear infection. Pt state she continues to feel bad, states "i just want some relief." Pt admits to body aches and headache. Denies neck pain, stiffness, chest pain, abdominal pain, urinary symptoms.   Past Medical History  Diagnosis Date  . Fibroid   . Glaucoma     Past Surgical History  Procedure Date  . Abdominal hysterectomy 1991    TAH,BSO  . Hernia repair   . Breast surgery     REDUCTION MAMMAPLASTY  . Tonsillectomy   . Replacement total knee 2008  . Eye surgery 2010  . Oophorectomy     BSO  . Eye surgery     shunt due to glaucoma    Family History  Problem Relation Age of Onset  . Cancer Mother     Bladder cancer  . Hypertension Sister   . Breast cancer Sister     BREAST AND BLADDER  . Cancer Sister     Bladder cancer  . Hypertension Brother   . COPD Brother     History  Substance Use Topics  . Smoking status: Former Games developer  . Smokeless tobacco: Not on file  . Alcohol Use: No    OB History    Grav Para Term Preterm Abortions TAB SAB Ect Mult Living   0                Review of Systems  Constitutional: Positive for fever. Negative for chills.  HENT: Positive for congestion. Negative for sore throat, neck pain and neck stiffness.   Eyes: Negative.   Respiratory: Positive for cough. Negative for shortness of breath and wheezing.   Cardiovascular: Negative for chest pain and leg swelling.  Gastrointestinal: Positive for nausea. Negative for vomiting and abdominal pain.  Genitourinary: Negative for dysuria, flank pain, vaginal bleeding, vaginal discharge and vaginal pain.  Musculoskeletal: Positive for myalgias.  Skin: Negative for rash.  Neurological: Positive for weakness. Negative for headaches.    Allergies  Aspirin and Sulfa antibiotics  Home Medications   Current Outpatient Rx  Name Route Sig Dispense Refill  . BIMATOPROST 0.03 % OP SOLN Left Eye Place 1 drop into the left eye at bedtime.    Marland Kitchen BRIMONIDINE TARTRATE 0.1 % OP SOLN Left Eye Place 1 drop into the left eye 3 (three) times daily.    Marland Kitchen VITAMIN D 1000 UNITS PO TABS Oral Take 1,000 Units by mouth daily.    Marland Kitchen ESTRADIOL 0.5 MG PO TABS  TAKE 1 TABLET BY MOUTH EVERY DAY 90 tablet 2  . OMEGA-3 FATTY ACIDS 1000 MG PO CAPS Oral Take 2 g by  mouth daily.    Marland Kitchen GARLIC PO TABS Oral Take 1 tablet by mouth daily.    Marland Kitchen HYDROCHLOROTHIAZIDE 12.5 MG PO CAPS Oral Take 25 mg by mouth daily.     Marland Kitchen METHAZOLAMIDE 50 MG PO TABS Oral Take 100 mg by mouth 2 (two) times daily.    Marland Kitchen TIMOLOL MALEATE 0.5 % (DAILY) OP SOLN Ophthalmic Apply 1 drop to eye daily. In left eye    . VITAMIN E 1000 UNITS PO CAPS Oral Take 1,000 Units by mouth daily.      BP 122/58  Pulse 83  Temp(Src) 98.9 F (37.2 C) (Oral)  Resp 16  SpO2 100%  Physical Exam  Nursing note and vitals reviewed. Constitutional: She is oriented to person, place, and time. She appears well-developed and well-nourished. No distress.  HENT:  Head: Normocephalic.  Right Ear: Tympanic membrane, external ear and ear canal normal.  Left  Ear: Tympanic membrane, external ear and ear canal normal.  Nose: Mucosal edema and rhinorrhea present.  Mouth/Throat: Uvula is midline and oropharynx is clear and moist.  Eyes: Conjunctivae are normal.  Neck: Normal range of motion. Neck supple.  Cardiovascular: Normal rate, regular rhythm and normal heart sounds.   Pulmonary/Chest: Effort normal and breath sounds normal. No respiratory distress.  Abdominal: Soft. Bowel sounds are normal. She exhibits no distension. There is no tenderness.  Musculoskeletal: Normal range of motion. She exhibits no edema.  Lymphadenopathy:    She has no cervical adenopathy.  Neurological: She is alert and oriented to person, place, and time.  Skin: Skin is warm and dry.  Psychiatric: She has a normal mood and affect.    ED Course  Procedures (including critical care time)  Labs Reviewed  URINALYSIS, ROUTINE W REFLEX MICROSCOPIC - Abnormal; Notable for the following:    Color, Urine AMBER (*) BIOCHEMICALS MAY BE AFFECTED BY COLOR   APPearance CLOUDY (*)    Bilirubin Urine SMALL (*)    Ketones, ur TRACE (*)    Protein, ur 30 (*)    Urobilinogen, UA 2.0 (*)    Leukocytes, UA TRACE (*)    All other components within normal limits  URINE MICROSCOPIC-ADD ON - Abnormal; Notable for the following:    Bacteria, UA MANY (*)    All other components within normal limits  URINE CULTURE   Dg Chest 2 View  04/07/2012  *RADIOLOGY REPORT*  Clinical Data: Fever, body aches, productive cough.  CHEST - 2 VIEW  Comparison: 01/27/2012  Findings: Interval development since previous study of infiltration in the left lung base behind the heart suggesting developing pneumonia.  Right lung appears clear expanded.  Normal heart size and pulmonary vascularity.  No blunting of costophrenic angles.  No pneumothorax.  IMPRESSION: Interval development of infiltration in the left lung base behind the heart suggesting pneumonia.  Original Report Authenticated By: Marlon Pel,  M.D.   Pneumonia on CXR. Will swtich to zithromax. VS here normal. Pt non toxic. No meningismus. No CP or SOB. Will follow up with her pcp. UA possibly infected, however, no WBCs, pt asymptomatic, will send cultures.    1. CAP (community acquired pneumonia)       MDM          Lottie Mussel, PA 04/08/12 [phone]

## 2012-04-08 NOTE — Discharge Instructions (Signed)
Your chest x-ray is positive for early pneumonia. Stop amoxicillin. Start taking zithromax tomorrow as prescribed. You can take over the counter cough medications. Take ibuprofen as prescribed on regular basis to keep your fever down and to help you feel better. It is very important that you drink plenty of fluids, even if you do not feel like it. Follow up with your doctor in 3 days.   Pneumonia, Adult Pneumonia is an infection of the lungs.  CAUSES Pneumonia may be caused by bacteria or a virus. Usually, these infections are caused by breathing infectious particles into the lungs (respiratory tract). SYMPTOMS   Cough.   Fever.   Chest pain.   Increased rate of breathing.   Wheezing.   Mucus production.  DIAGNOSIS  If you have the common symptoms of pneumonia, your caregiver will typically confirm the diagnosis with a chest X-ray. The X-ray will show an abnormality in the lung (pulmonary infiltrate) if you have pneumonia. Other tests of your blood, urine, or sputum may be done to find the specific cause of your pneumonia. Your caregiver may also do tests (blood gases or pulse oximetry) to see how well your lungs are working. TREATMENT  Some forms of pneumonia may be spread to other people when you cough or sneeze. You may be asked to wear a mask before and during your exam. Pneumonia that is caused by bacteria is treated with antibiotic medicine. Pneumonia that is caused by the influenza virus may be treated with an antiviral medicine. Most other viral infections must run their course. These infections will not respond to antibiotics.  PREVENTION A pneumococcal shot (vaccine) is available to prevent a common bacterial cause of pneumonia. This is usually suggested for:  People over 27 years old.   Patients on chemotherapy.   People with chronic lung problems, such as bronchitis or emphysema.   People with immune system problems.  If you are over 65 or have a high risk condition, you  may receive the pneumococcal vaccine if you have not received it before. In some countries, a routine influenza vaccine is also recommended. This vaccine can help prevent some cases of pneumonia.You may be offered the influenza vaccine as part of your care. If you smoke, it is time to quit. You may receive instructions on how to stop smoking. Your caregiver can provide medicines and counseling to help you quit. HOME CARE INSTRUCTIONS   Cough suppressants may be used if you are losing too much rest. However, coughing protects you by clearing your lungs. You should avoid using cough suppressants if you can.   Your caregiver may have prescribed medicine if he or she thinks your pneumonia is caused by a bacteria or influenza. Finish your medicine even if you start to feel better.   Your caregiver may also prescribe an expectorant. This loosens the mucus to be coughed up.   Only take over-the-counter or prescription medicines for pain, discomfort, or fever as directed by your caregiver.   Do not smoke. Smoking is a common cause of bronchitis and can contribute to pneumonia. If you are a smoker and continue to smoke, your cough may last several weeks after your pneumonia has cleared.   A cold steam vaporizer or humidifier in your room or home may help loosen mucus.   Coughing is often worse at night. Sleeping in a semi-upright position in a recliner or using a couple pillows under your head will help with this.   Get rest as you feel it  is needed. Your body will usually let you know when you need to rest.  SEEK IMMEDIATE MEDICAL CARE IF:   Your illness becomes worse. This is especially true if you are elderly or weakened from any other disease.   You cannot control your cough with suppressants and are losing sleep.   You begin coughing up blood.   You develop pain which is getting worse or is uncontrolled with medicines.   You have a fever.   Any of the symptoms which initially brought you  in for treatment are getting worse rather than better.   You develop shortness of breath or chest pain.  MAKE SURE YOU:   Understand these instructions.   Will watch your condition.   Will get help right away if you are not doing well or get worse.  Document Released: 11/12/2005 Document Revised: 11/01/2011 Document Reviewed: 02/01/2011 [facility] Patient Information 2012 [place], [place].

## 2012-04-08 NOTE — ED Notes (Signed)
MD/PA at bedside. 

## 2012-04-08 NOTE — ED Notes (Signed)
Patient given discharge instructions, information, prescriptions, and diet order. Patient states that they adequately understand discharge information given and to return to ED if symptoms return or worsen.     

## 2012-04-09 LAB — URINE CULTURE
Colony Count: NO GROWTH
Culture  Setup Time: 201305140832
Culture: NO GROWTH

## 2012-05-16 ENCOUNTER — Other Ambulatory Visit: Payer: Self-pay | Admitting: Obstetrics and Gynecology

## 2012-08-25 DIAGNOSIS — H269 Unspecified cataract: Secondary | ICD-10-CM | POA: Insufficient documentation

## 2012-08-25 DIAGNOSIS — H401190 Primary open-angle glaucoma, unspecified eye, stage unspecified: Secondary | ICD-10-CM | POA: Insufficient documentation

## 2012-11-12 ENCOUNTER — Ambulatory Visit (INDEPENDENT_AMBULATORY_CARE_PROVIDER_SITE_OTHER): Payer: BC Managed Care – PPO | Admitting: Obstetrics and Gynecology

## 2012-11-12 ENCOUNTER — Encounter: Payer: Self-pay | Admitting: Obstetrics and Gynecology

## 2012-11-12 VITALS — BP 130/78 | Ht 63.0 in | Wt 222.0 lb

## 2012-11-12 DIAGNOSIS — N951 Menopausal and female climacteric states: Secondary | ICD-10-CM

## 2012-11-12 DIAGNOSIS — Z01419 Encounter for gynecological examination (general) (routine) without abnormal findings: Secondary | ICD-10-CM

## 2012-11-12 DIAGNOSIS — R232 Flushing: Secondary | ICD-10-CM

## 2012-11-12 MED ORDER — ESTRADIOL 0.5 MG PO TABS: 0.5000 mg | ORAL_TABLET | Freq: Every day | ORAL | Status: DC

## 2012-11-12 NOTE — Progress Notes (Signed)
Patient came to see me today for her annual GYN exam. She had a total abdominal hysterectomy, bilateral salpingo-oophorectomy in 1991 for uterine fibroids. She is always had normal Pap smears. Her last Pap smear was 2012. She remains on oral estrogen for menopausal symptoms. She tried a patch but it would not stick. She had a mammogram this year. We do not have the report. She will get [place] a copy of it. She was told it was normal. She is having no vaginal bleeding. She is having no pelvic pain. She has had several normal bone densities. She does her lab through her PCP. Her sister had breast cancer at age 64. She has several paternal cousins who also had early-onset breast cancer.  HEENT: Within normal limits.Kennon Portela present. Neck: No masses. Supraclavicular lymph nodes: Not enlarged. Breasts: Examined in both sitting and lying position. Symmetrical without skin changes or masses. Abdomen: Soft no masses guarding or rebound. No hernias. Pelvic: External within normal limits. BUS within normal limits. Vaginal examination shows good estrogen effect, no cystocele enterocele or rectocele. Cervix and uterus absent. Adnexa within normal limits. Rectovaginal confirmatory. Extremities within normal limits.  Assessment: Menopausal symptoms. Family history of early onset breast cancer.  Plan: Continue estradiol. Discussed slightly higher risk of DVT and stroke. Continue yearly mammograms. Patient will get her sister tested for BRCA1 and 2. The patient herself probably does not need to be tested because she has had her ovaries removed and has no children. Pap not done.The new Pap smear guidelines were discussed with the patient.

## 2012-11-12 NOTE — Patient Instructions (Addendum)
Find out if sister who had breast cancer early was tested for BRCA1 and BRCA2. You asked for a suggestion of a good medical doctor and I suggest to see Dr. Alonza Smoker. Continue yearly mammograms.

## 2012-11-13 LAB — URINALYSIS W MICROSCOPIC + REFLEX CULTURE
Bilirubin Urine: NEGATIVE
Crystals: NONE SEEN
Hgb urine dipstick: NEGATIVE
Ketones, ur: NEGATIVE mg/dL
Protein, ur: NEGATIVE mg/dL
Urobilinogen, UA: 0.2 mg/dL (ref 0.0–1.0)

## 2012-11-14 LAB — URINE CULTURE

## 2012-11-21 ENCOUNTER — Encounter: Payer: Self-pay | Admitting: Obstetrics and Gynecology

## 2013-01-13 DIAGNOSIS — N200 Calculus of kidney: Secondary | ICD-10-CM | POA: Insufficient documentation

## 2013-01-19 ENCOUNTER — Ambulatory Visit: Payer: BC Managed Care – PPO | Admitting: Family Medicine

## 2013-01-28 ENCOUNTER — Other Ambulatory Visit: Payer: Self-pay | Admitting: Obstetrics and Gynecology

## 2013-04-18 ENCOUNTER — Encounter: Payer: Self-pay | Admitting: Cardiovascular Disease

## 2013-05-05 ENCOUNTER — Encounter: Payer: Self-pay | Admitting: Family Medicine

## 2013-05-05 ENCOUNTER — Ambulatory Visit (INDEPENDENT_AMBULATORY_CARE_PROVIDER_SITE_OTHER): Payer: BC Managed Care – PPO | Admitting: Family Medicine

## 2013-05-05 VITALS — BP 130/80 | Temp 98.4°F | Wt 230.0 lb

## 2013-05-05 DIAGNOSIS — E663 Overweight: Secondary | ICD-10-CM

## 2013-05-05 DIAGNOSIS — E8941 Symptomatic postprocedural ovarian failure: Secondary | ICD-10-CM

## 2013-05-05 DIAGNOSIS — R000 Tachycardia, unspecified: Secondary | ICD-10-CM

## 2013-05-05 DIAGNOSIS — M1990 Unspecified osteoarthritis, unspecified site: Secondary | ICD-10-CM

## 2013-05-05 DIAGNOSIS — Z87442 Personal history of urinary calculi: Secondary | ICD-10-CM

## 2013-05-05 DIAGNOSIS — Z23 Encounter for immunization: Secondary | ICD-10-CM

## 2013-05-05 DIAGNOSIS — Z90710 Acquired absence of both cervix and uterus: Secondary | ICD-10-CM

## 2013-05-05 LAB — CBC WITH DIFFERENTIAL/PLATELET
Basophils Relative: 0.8 % (ref 0.0–3.0)
Eosinophils Relative: 6.3 % — ABNORMAL HIGH (ref 0.0–5.0)
HCT: 41.8 % (ref 36.0–46.0)
Hemoglobin: 13.8 g/dL (ref 12.0–15.0)
Lymphs Abs: 2 10*3/uL (ref 0.7–4.0)
MCV: 88.6 fl (ref 78.0–100.0)
Monocytes Absolute: 0.7 10*3/uL (ref 0.1–1.0)
Monocytes Relative: 11.5 % (ref 3.0–12.0)
Neutro Abs: 2.7 10*3/uL (ref 1.4–7.7)
Platelets: 213 10*3/uL (ref 150.0–400.0)
RBC: 4.72 Mil/uL (ref 3.87–5.11)
WBC: 5.8 10*3/uL (ref 4.5–10.5)

## 2013-05-05 LAB — LIPID PANEL
Cholesterol: 250 mg/dL — ABNORMAL HIGH (ref 0–200)
Total CHOL/HDL Ratio: 6
Triglycerides: 100 mg/dL (ref 0.0–149.0)
VLDL: 20 mg/dL (ref 0.0–40.0)

## 2013-05-05 LAB — BASIC METABOLIC PANEL
BUN: 13 mg/dL (ref 6–23)
CO2: 32 mEq/L (ref 19–32)
Chloride: 103 mEq/L (ref 96–112)
Potassium: 4.4 mEq/L (ref 3.5–5.1)

## 2013-05-05 LAB — POCT URINALYSIS DIPSTICK
Bilirubin, UA: NEGATIVE
Ketones, UA: NEGATIVE
Leukocytes, UA: NEGATIVE
Spec Grav, UA: <=1.005
pH, UA: 5.5

## 2013-05-05 LAB — LDL CHOLESTEROL, DIRECT: Direct LDL: 197.1 mg/dL

## 2013-05-05 LAB — HEPATIC FUNCTION PANEL
Albumin: 3.9 g/dL (ref 3.5–5.2)
Total Bilirubin: 0.9 mg/dL (ref 0.3–1.2)

## 2013-05-05 MED ORDER — HYDROCHLOROTHIAZIDE 25 MG PO TABS: 25.0000 mg | ORAL_TABLET | Freq: Every day | ORAL | Status: DC

## 2013-05-05 NOTE — Progress Notes (Signed)
  Subjective:    Patient ID: [name], female    DOB: [birth year], 62 y.o.   MRN: [id number]  HPI [name] is a delightful 62 year old widowed female nonsmoker G0 P0 who comes in today as a new patient for evaluation of multiple issues  Her primary issue is her weight. She says she goes to gym every other day and can't lose weight. Weight today 230 pounds  She has a history of glaucoma and is on eyedrops. She's followup at [facility].  She is on Estrace 0.5 mg daily for 21 years. He had her uterus and ovaries removed in her late 30s for fibroids. Because of the fibroid she never had any children.   She takes hydrochlorothiazide 12.5 mg daily for mild hypertension  She has 2 kidney stones in her left kidney currently asymptomatic  She has a history of rapid heart rate. She had an evaluation nondiagnostic she stopped caffeine and the rapid heart rate went away. 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   HENT: Negative.   Eyes: Negative.   Respiratory: Negative.   Cardiovascular: Negative.   Gastrointestinal: Negative.   Genitourinary: Negative.   Musculoskeletal: Negative.   Neurological: Negative.   Psychiatric/Behavioral: Negative.        Objective:   Physical Exam  Constitutional: She appears well-developed and well-nourished.  HENT:  Head: Normocephalic and atraumatic.  Right Ear: External ear normal.  Left Ear: External ear normal.  Nose: Nose normal.  Mouth/Throat: Oropharynx is clear and moist.  Eyes: EOM are normal. Pupils are equal, round, and reactive to light.  Neck: Normal range of motion. Neck supple. No thyromegaly present.  Cardiovascular: Normal rate, regular rhythm, normal heart sounds and intact distal pulses.  Exam reveals  no gallop and no friction rub.   No murmur heard. No carotid or aortic bruits peripheral pulses 2 not plus and symmetrical  Pulmonary/Chest: Effort normal and breath sounds normal.  Abdominal: Soft. Bowel sounds are normal. She exhibits no distension and no mass. There is no tenderness. There is no rebound.  Genitourinary:  Bilateral breast exam normal,,,,,,,scars from surgical reduction  Musculoskeletal: Normal range of motion.  Scar right knee previous total right knee replacement  Lymphadenopathy:    She has no cervical adenopathy.  Neurological: She is alert. She has normal reflexes. No cranial nerve deficit. She exhibits normal muscle tone. Coordination normal.  Skin: Skin is warm and dry. No rash noted. No erythema. No pallor.  Total body skin exam normal  Psychiatric: She has a normal mood and affect. Her behavior is normal. Judgment and thought content normal.          Assessment & Plan:  Hypertension continue hydrochlorothiazide one daily  Postmenopausal hot flashes continue Estrace 0.5 mg daily  Glaucoma continue eyedrops via ophthalmologist,,,,, she goes to [facility]  Overweight begin diet exercise and weight loss program  Status post bilateral reduction mammoplasty  Status post total right knee replacement  History of rapid heart rate secondary to caffeine  2 kidney stones left kidney asymptomatic

## 2013-05-05 NOTE — Patient Instructions (Signed)
Labs today  Remember to do a thorough breast exam monthly and get an annual mammogram  Begin a diet and exercise program,,,,,,,,,,, at 1500 calories daily,,,,,,,,,, 30 ounces of water daily,,,,,,,,,, 30 minutes of walking daily  Continue hydrochlorothiazide one tablet daily 25 mg

## 2013-06-15 ENCOUNTER — Encounter: Payer: Self-pay | Admitting: Gynecology

## 2013-06-19 ENCOUNTER — Encounter: Payer: Self-pay | Admitting: *Deleted

## 2013-06-22 ENCOUNTER — Encounter: Payer: Self-pay | Admitting: Internal Medicine

## 2013-06-22 ENCOUNTER — Ambulatory Visit (INDEPENDENT_AMBULATORY_CARE_PROVIDER_SITE_OTHER): Payer: BC Managed Care – PPO | Admitting: Internal Medicine

## 2013-06-22 VITALS — BP 136/84 | HR 78 | Ht 63.0 in | Wt 229.8 lb

## 2013-06-22 DIAGNOSIS — E663 Overweight: Secondary | ICD-10-CM

## 2013-06-22 DIAGNOSIS — Z9989 Dependence on other enabling machines and devices: Secondary | ICD-10-CM | POA: Insufficient documentation

## 2013-06-22 DIAGNOSIS — R000 Tachycardia, unspecified: Secondary | ICD-10-CM

## 2013-06-22 DIAGNOSIS — G4733 Obstructive sleep apnea (adult) (pediatric): Secondary | ICD-10-CM

## 2013-06-22 NOTE — Progress Notes (Signed)
OFFICE NOTE  Chief Complaint:  Routine followup  Primary Care Physician: Evette Georges, MD  HPI:  [name] is a 62 year old female with a history of hypertension, dyslipidemia, obstructive sleep apnea on CPAP and palpitations. Recently, she reported her palpitations have improved, and we discussed in the past about possibly starting low-dose atenolol. However, she never had that prescription filled. One thing she pointed out is that she has been drinking Aqua Blue distilled water for some period of time. It is actually interesting because that water contains no minerals, and she may get very few minerals in her diet. Recently, she started taking mineral supplements, and she has reported that her palpitations have improved, suggesting it may be related to a mineral deficiency. Overall, she sleeps well with her CPAP and feels that that is working well for her until recently, when her machine broke. She has been using her cousin's. She also has dyslipidemia and her cholesterol is high on recent blood draw. This may be due to recent weight gain.  PMHx:  Past Medical History  Diagnosis Date  . Glaucoma   . Fibroid   . Hypertension   . Hyperlipidemia   . OSA on CPAP   . History of palpitations   . History of echocardiogram 10/16/2011    EF >55%; borderline concentric LVH; mild MR, mild TR    Past Surgical History  Procedure Laterality Date  . Abdominal hysterectomy  1991    TAH,BSO  . Hernia repair    . Breast surgery      REDUCTION MAMMAPLASTY  . Tonsillectomy    . Replacement total knee  2008  . Eye surgery  2010  . Oophorectomy      BSO  . Eye surgery      shunt due to glaucoma    FAMHx:  Family History  Problem Relation Age of Onset  . Cancer Mother     Bladder cancer  . Hypertension Sister   . Breast cancer Sister     Age 17  . Cancer Sister     Bladder cancer  . Hypertension Brother   . COPD Brother   . Stroke Maternal Grandmother     SOCHx:   reports that she quit smoking about 32 years ago. She has never used smokeless tobacco. She reports that she does not drink alcohol or use illicit drugs.  ALLERGIES:  Allergies  Allergen Reactions  . Aspirin     Can not take Aspirin and Sulfa in combination  . Sulfa Antibiotics     ROS: A comprehensive review of systems was negative except for: Constitutional: positive for weight gain, sleep apnea Cardiovascular: positive for dyspnea  HOME MEDS: Current Outpatient Prescriptions  Medication Sig Dispense Refill  . bimatoprost (LUMIGAN) 0.03 % ophthalmic solution Place 1 drop into the left eye at bedtime.      . brimonidine (ALPHAGAN P) 0.1 % SOLN Place 1 drop into the left eye 3 (three) times daily.      . cholecalciferol (VITAMIN D) 1000 UNITS tablet Take 1,000 Units by mouth daily.      Marland Kitchen estradiol (ESTRACE) 0.5 MG tablet Take 1 tablet (0.5 mg total) by mouth daily.  90 tablet  3  . fish oil-omega-3 fatty acids 1000 MG capsule Take 2 g by mouth daily.      . Garlic TABS Take 1 tablet by mouth daily.      . hydrochlorothiazide (HYDRODIURIL) 25 MG tablet Take 1 tablet (25 mg total) by mouth daily.  90 tablet  3  . methazolamide (NEPTAZANE) 50 MG tablet Take 50 mg by mouth 2 (two) times daily.      . NON FORMULARY CPAP      . Timolol Maleate (ISTALOL) 0.5 % (DAILY) SOLN Apply 1 drop to eye daily. In left eye      . vitamin E 1000 UNIT capsule Take 1,000 Units by mouth daily.       No current facility-administered medications for this visit.    LABS/IMAGING: No results found for this or any previous visit (from the past 48 hour(s)). No results found.  VITALS: BP 136/84  Pulse 78  Ht 5\' 3"  (1.6 m)  Wt 229 lb 12.8 oz (104.237 kg)  BMI 40.72 kg/m2  EXAM: General appearance: alert and no distress Neck: no adenopathy, no carotid bruit, no JVD, supple, symmetrical, trachea midline and thyroid not enlarged, symmetric, no tenderness/mass/nodules Lungs: clear to auscultation  bilaterally Heart: regular rate and rhythm, S1, S2 normal, no murmur, click, rub or gallop Abdomen: soft, non-tender; bowel sounds normal; no masses,  no organomegaly Extremities: extremities normal, atraumatic, no cyanosis or edema Pulses: 2+ and symmetric Skin: Skin color, texture, turgor normal. No rashes or lesions Neurologic: Grossly normal  EKG: Normal sinus rhythm at 78  ASSESSMENT: 1. Structures sleep apnea on CPAP 2. Hypertension 3. Dyslipidemia 4. Palpitations 5. Morbid obesity  PLAN: 1.   [name] has been gaining weight and her cholesterol is higher than normal. She needs to work on exercise, dietary changes and weight loss. Some of this may be due to poor sleep at night as she noted her CPAP machine has broken. We will send her back for a sleep fitting so hopefully she can get a machine that is appropriately treating her. I also encouraged her to work on exercise and weight loss. She does have a new primary care provider hopefully will work closely with her on her weight.  [name], MD, [facility] Attending Cardiologist [facility]  , C 06/22/2013, 1:06 PM

## 2013-06-22 NOTE — Patient Instructions (Addendum)
Your physician wants you to follow-up in: 1 year.  You will receive a reminder letter in the mail two months in advance. If you don't receive a letter, please call our office to schedule the follow-up appointment.  Dr. Rennis Golden wants you to have a sleep study to have your CPAP machine titrated.

## 2013-07-15 DIAGNOSIS — G4730 Sleep apnea, unspecified: Secondary | ICD-10-CM

## 2013-09-15 ENCOUNTER — Telehealth: Payer: Self-pay | Admitting: *Deleted

## 2013-09-15 NOTE — Telephone Encounter (Signed)
Faxed signed confirmation of order to [facility] on 09/15/13.

## 2013-09-20 ENCOUNTER — Ambulatory Visit (INDEPENDENT_AMBULATORY_CARE_PROVIDER_SITE_OTHER): Payer: BC Managed Care – PPO

## 2013-09-20 ENCOUNTER — Ambulatory Visit: Payer: BC Managed Care – PPO | Admitting: Emergency Medicine

## 2013-09-20 VITALS — BP 130/80 | HR 72 | Temp 98.6°F | Resp 18 | Ht 63.5 in | Wt 231.0 lb

## 2013-09-20 DIAGNOSIS — J209 Acute bronchitis, unspecified: Secondary | ICD-10-CM

## 2013-09-20 DIAGNOSIS — J018 Other acute sinusitis: Secondary | ICD-10-CM

## 2013-09-20 MED ORDER — HYDROCOD POLST-CHLORPHEN POLST 10-8 MG/5ML PO LQCR: 5.0000 mL | Freq: Two times a day (BID) | ORAL | Status: DC | PRN

## 2013-09-20 MED ORDER — AMOXICILLIN-POT CLAVULANATE 875-125 MG PO TABS: 1.0000 | ORAL_TABLET | Freq: Two times a day (BID) | ORAL | Status: DC

## 2013-09-20 NOTE — Progress Notes (Signed)
[facility] [address], [place] [address] [phone]  Date:  09/20/2013   Name:  [name]   DOB:  05/07/1951   MRN:  [id number]  PCP:  Evette Georges, MD    Chief Complaint: Cough   History of Present Illness:  [name] is a 62 y.o. very pleasant female patient who presents with the following:  Ill most of the week with nasal congestion and post nasal drip.  Cough worse at night.  Purulent sputum.  No fever or chills.  No wheezing or shortness of breath. No nausea or vomiting.  No stool change or rash.  No improvement with over the counter medications or other home remedies. Denies other complaint or health concern today.   Patient Active Problem List   Diagnosis Date Noted  . OSA on CPAP 06/22/2013  . Overweight 05/05/2013  . Status post hysterectomy with oophorectomy 05/05/2013  . Hot flashes due to surgical menopause 05/05/2013  . Degenerative joint disease 05/05/2013  . Rapid heart rate 05/05/2013  . History of kidney stones 05/05/2013  . Glaucoma     Past Medical History  Diagnosis Date  . Glaucoma   . Fibroid   . Hypertension   . Hyperlipidemia   . OSA on CPAP   . History of palpitations   . History of echocardiogram 10/16/2011    EF >55%; borderline concentric LVH; mild MR, mild TR    Past Surgical History  Procedure Laterality Date  . Abdominal hysterectomy  1991    TAH,BSO  . Hernia repair    . Breast surgery      REDUCTION MAMMAPLASTY  . Tonsillectomy    . Replacement total knee  2008  . Eye surgery  2010  . Oophorectomy      BSO  . Eye surgery      shunt due to glaucoma    History  Substance Use Topics  . Smoking status: Former Smoker    Quit date: 11/26/1980  . Smokeless tobacco: Never Used  . Alcohol Use: No    Family History  Problem Relation Age of Onset  . Cancer Mother     Bladder cancer  . Hypertension Sister   . Breast cancer Sister     Age 11  . Cancer Sister     Bladder  cancer  . Hypertension Brother   . COPD Brother   . Stroke Maternal Grandmother     Allergies  Allergen Reactions  . Aspirin     Can not take Aspirin and Sulfa in combination  . Sulfa Antibiotics     Medication list has been reviewed and updated.  Current Outpatient Prescriptions on File Prior to Visit  Medication Sig Dispense Refill  . bimatoprost (LUMIGAN) 0.03 % ophthalmic solution Place 1 drop into the left eye at bedtime.      . brimonidine (ALPHAGAN P) 0.1 % SOLN Place 1 drop into the left eye 3 (three) times daily.      . cholecalciferol (VITAMIN D) 1000 UNITS tablet Take 1,000 Units by mouth daily.      Marland Kitchen estradiol (ESTRACE) 0.5 MG tablet Take 1 tablet (0.5 mg total) by mouth daily.  90 tablet  3  . fish oil-omega-3 fatty acids 1000 MG capsule Take 2 g by mouth daily.      . hydrochlorothiazide (HYDRODIURIL) 25 MG tablet Take 1 tablet (25 mg total) by mouth daily.  90 tablet  3  . methazolamide (NEPTAZANE) 50 MG  tablet Take 50 mg by mouth 2 (two) times daily.      . NON FORMULARY CPAP      . Timolol Maleate (ISTALOL) 0.5 % (DAILY) SOLN Apply 1 drop to eye daily. In left eye      . vitamin E 1000 UNIT capsule Take 1,000 Units by mouth daily.      . Garlic TABS Take 1 tablet by mouth daily.       No current facility-administered medications on file prior to visit.    Review of Systems:  As per HPI, otherwise negative.    Physical Examination: Filed Vitals:   09/20/13 1317  BP: 130/80  Pulse: 72  Temp: 98.6 F (37 C)  Resp: 18   Filed Vitals:   09/20/13 1317  Height: 5' 3.5" (1.613 m)  Weight: 231 lb (104.781 kg)   Body mass index is 40.27 kg/(m^2). Ideal Body Weight: Weight in (lb) to have BMI = 25: 143.1  GEN: WDWN, NAD, Non-toxic, A & O x 3 HEENT: Atraumatic, Normocephalic. Neck supple. No masses, No LAD. Ears and Nose: No external deformity. CV: RRR, No M/G/R. No JVD. No thrill. No extra heart sounds. PULM: CTA B, no wheezes, crackles, rhonchi. No  retractions. No resp. distress. No accessory muscle use. ABD: S, NT, ND, +BS. No rebound. No HSM. EXTR: No c/c/e NEURO Normal gait.  PSYCH: Normally interactive. Conversant. Not depressed or anxious appearing.  Calm demeanor.    Assessment and Plan: Bronchitis Sinusitis augmentin tussionex  Signed,  Phillips Odor, MD

## 2013-09-20 NOTE — Patient Instructions (Signed)
Sinusitis Sinusitis is redness, soreness, and swelling (inflammation) of the paranasal sinuses. Paranasal sinuses are air pockets within the bones of your face (beneath the eyes, the middle of the forehead, or above the eyes). In healthy paranasal sinuses, mucus is able to drain out, and air is able to circulate through them by way of your nose. However, when your paranasal sinuses are inflamed, mucus and air can become trapped. This can allow bacteria and other germs to grow and cause infection. Sinusitis can develop quickly and last only a short time (acute) or continue over a long period (chronic). Sinusitis that lasts for more than 12 weeks is considered chronic.  CAUSES  Causes of sinusitis include:  Allergies.  Structural abnormalities, such as displacement of the cartilage that separates your nostrils (deviated septum), which can decrease the air flow through your nose and sinuses and affect sinus drainage.  Functional abnormalities, such as when the small hairs (cilia) that line your sinuses and help remove mucus do not work properly or are not present. SYMPTOMS  Symptoms of acute and chronic sinusitis are the same. The primary symptoms are pain and pressure around the affected sinuses. Other symptoms include:  Upper toothache.  Earache.  Headache.  Bad breath.  Decreased sense of smell and taste.  A cough, which worsens when you are lying flat.  Fatigue.  Fever.  Thick drainage from your nose, which often is green and may contain pus (purulent).  Swelling and warmth over the affected sinuses. DIAGNOSIS  Your caregiver will perform a physical exam. During the exam, your caregiver may:  Look in your nose for signs of abnormal growths in your nostrils (nasal polyps).  Tap over the affected sinus to check for signs of infection.  View the inside of your sinuses (endoscopy) with a special imaging device with a light attached (endoscope), which is inserted into your  sinuses. If your caregiver suspects that you have chronic sinusitis, one or more of the following tests may be recommended:  Allergy tests.  Nasal culture A sample of mucus is taken from your nose and sent to a lab and screened for bacteria.  Nasal cytology A sample of mucus is taken from your nose and examined by your caregiver to determine if your sinusitis is related to an allergy. TREATMENT  Most cases of acute sinusitis are related to a viral infection and will resolve on their own within 10 days. Sometimes medicines are prescribed to help relieve symptoms (pain medicine, decongestants, nasal steroid sprays, or saline sprays).  However, for sinusitis related to a bacterial infection, your caregiver will prescribe antibiotic medicines. These are medicines that will help kill the bacteria causing the infection.  Rarely, sinusitis is caused by a fungal infection. In theses cases, your caregiver will prescribe antifungal medicine. For some cases of chronic sinusitis, surgery is needed. Generally, these are cases in which sinusitis recurs more than 3 times per year, despite other treatments. HOME CARE INSTRUCTIONS   Drink plenty of water. Water helps thin the mucus so your sinuses can drain more easily.  Use a humidifier.  Inhale steam 3 to 4 times a day (for example, sit in the bathroom with the shower running).  Apply a warm, moist washcloth to your face 3 to 4 times a day, or as directed by your caregiver.  Use saline nasal sprays to help moisten and clean your sinuses.  Take over-the-counter or prescription medicines for pain, discomfort, or fever only as directed by your caregiver. SEEK IMMEDIATE MEDICAL   or as directed by your caregiver.   Use saline nasal sprays to help moisten and clean your sinuses.   Take over-the-counter or prescription medicines for pain, discomfort, or fever only as directed by your caregiver.  SEEK IMMEDIATE MEDICAL CARE IF:   You have increasing pain or severe headaches.   You have nausea, vomiting, or drowsiness.   You have swelling around your face.   You have vision problems.   You have a stiff neck.   You have difficulty breathing.  MAKE SURE YOU:    Understand these  instructions.   Will watch your condition.   Will get help right away if you are not doing well or get worse.  Document Released: 11/12/2005 Document Revised: 02/04/2012 Document Reviewed: 11/27/2011  Exit[employer] Patient Information 2014 [employer]  Bronchitis  Bronchitis is the body's way of reacting to injury and/or infection (inflammation) of the bronchi. Bronchi are the air tubes that extend from the windpipe into the lungs. If the inflammation becomes severe, it may cause shortness of breath.  CAUSES   Inflammation may be caused by:   A virus.   Germs (bacteria).   Dust.   Allergens.   Pollutants and many other irritants.  The cells lining the bronchial tree are covered with tiny hairs (cilia). These constantly beat upward, away from the lungs, toward the mouth. This keeps the lungs free of pollutants. When these cells become too irritated and are unable to do their job, mucus begins to develop. This causes the characteristic cough of bronchitis. The cough clears the lungs when the cilia are unable to do their job. Without either of these protective mechanisms, the mucus would settle in the lungs. Then you would develop pneumonia.  Smoking is a common cause of bronchitis and can contribute to pneumonia. Stopping this habit is the single most important thing you can do to help yourself.  TREATMENT    Your caregiver may prescribe an antibiotic if the cough is caused by bacteria. Also, medicines that open up your airways make it easier to breathe. Your caregiver may also recommend or prescribe an expectorant. It will loosen the mucus to be coughed up. Only take over-the-counter or prescription medicines for pain, discomfort, or fever as directed by your caregiver.   Removing whatever causes the problem (smoking, for example) is critical to preventing the problem from getting worse.   Cough suppressants may be prescribed for relief of cough symptoms.   Inhaled medicines may be prescribed to help with  symptoms now and to help prevent problems from returning.   For those with recurrent (chronic) bronchitis, there may be a need for steroid medicines.  SEEK IMMEDIATE MEDICAL CARE IF:    During treatment, you develop more pus-like mucus (purulent sputum).   You have a fever.   Your baby is older than 3 months with a rectal temperature of 102 F (38.9 C) or higher.   Your baby is 3 months old or younger with a rectal temperature of 100.4 F (38 C) or higher.   You become progressively more ill.   You have increased difficulty breathing, wheezing, or shortness of breath.  It is necessary to seek immediate medical care if you are elderly or sick from any other disease.  MAKE SURE YOU:    Understand these instructions.   Will watch your condition.   Will get help right away if you are not doing well or get worse.  Document Released: 11/12/2005 Document Revised: 02/04/2012 Document Reviewed: 09/21/2008

## 2013-09-24 ENCOUNTER — Telehealth: Payer: Self-pay | Admitting: Family Medicine

## 2013-09-24 NOTE — Telephone Encounter (Signed)
Patient Information:  Caller Name: [place]  Phone: [phone]  Patient: [name], [name]  Gender: Female  DOB: [birth year]  Age: 62 Years  PCP: Kelle Darting [facility])  Office Follow Up:  Does the office need to follow up with this patient?: No  Instructions For The Office: N/A  RN Note:  Patient cannot come to the office she is at work, Requesting an appt for evaluation tomorrow 09/25/13  with Dr. Fabian Sharp at 09:45 am  Symptoms  Reason For Call & Symptoms: Patient reports cough x1 week. She was seen at [facility] on Sunday and diagnosed with Bronchitis. Given Cough syrup with Codiene that she takes at night which helps but not taking during the day. Wheezing per patient at night.   +coughing productive yellow/ now with blood tinge. Afebrile.  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  Exposure to ammonia, clorax.  This started after new job but unsure.  Reviewed Health History In EMR: Yes  Reviewed Medications In EMR: Yes  Reviewed Allergies In EMR: Yes  Reviewed Surgeries / Procedures: Yes  Date of Onset of Symptoms: 09/17/2013  Treatments Tried: Cough syrup with codiene. Mucinex  Treatments Tried Worked: Yes  Guideline(s) Used:  Cough  Disposition Per Guideline:   See Today in Office  Reason For Disposition Reached:   Coughing up rusty-colored (reddish-brown) or blood-tinged sputum  Advice Given:  Reassurance  Coughing is the way that our lungs remove irritants and mucus. It helps protect our lungs from getting pneumonia.  You can get a dry hacking cough after a chest cold. Sometimes this type of cough can last 1-3 weeks, and be worse at night.  You can also get a cough after being exposed to irritating substances like smoke, strong perfumes, and dust.  Cough Medicines:  Home Remedy - Honey: This old home remedy has been shown to help decrease coughing at night. The adult dosage is 2 teaspoons (10 ml) at bedtime. Honey should not be given to infants under one year of age.  Coughing Spasms:  Drink warm fluids. Inhale warm mist (Reason: both relax the airway and loosen up the phlegm).  Suck on cough drops or hard candy to coat the irritated throat.  Prevent Dehydration:  Drink adequate liquids.  This will help soothe an irritated or dry throat and loosen up the phlegm.  Avoid Tobacco Smoke:  Smoking or being exposed to smoke makes coughs much worse.  Call Back If:  Difficulty breathing  You become worse.  RN Overrode Recommendation:  Make Appointment  Patient cannot go to the office today. Request appt tomorrow 09/25/13  Appointment Scheduled:  09/25/2013 09:45:00 Appointment Scheduled Provider:  Berniece Andreas [facility])

## 2013-09-25 ENCOUNTER — Ambulatory Visit (INDEPENDENT_AMBULATORY_CARE_PROVIDER_SITE_OTHER)
Admission: RE | Admit: 2013-09-25 | Discharge: 2013-09-25 | Disposition: A | Discharge status: Home / Self Care | Payer: BC Managed Care – PPO | Source: Ambulatory Visit | Attending: Internal Medicine | Admitting: Internal Medicine

## 2013-09-25 ENCOUNTER — Ambulatory Visit (INDEPENDENT_AMBULATORY_CARE_PROVIDER_SITE_OTHER): Payer: BC Managed Care – PPO | Admitting: Internal Medicine

## 2013-09-25 ENCOUNTER — Encounter: Payer: Self-pay | Admitting: Internal Medicine

## 2013-09-25 VITALS — BP 122/66 | HR 81 | Temp 98.2°F | Wt 230.0 lb

## 2013-09-25 DIAGNOSIS — R042 Hemoptysis: Secondary | ICD-10-CM

## 2013-09-25 DIAGNOSIS — J209 Acute bronchitis, unspecified: Secondary | ICD-10-CM

## 2013-09-25 NOTE — Progress Notes (Signed)
Chief Complaint  Patient presents with  . Cough    Pt has red streaked sputum.  Ongoing for 2 weeks.  Has tried Mucinex.  Wasl seen in Urgent Care and was given hydrocodone cough syrup.  Helps at night but is still coughing during the day.    HPI: Patient comes in today for SDA for   problem evaluation. Seen elsewhere . PCP na  Has been seen [facility]  Other facility practice 5-6 days ago  On weekend and note said  augmentin but said didn t get antibiotic.  ( didn't pick it up unaware ) had to go to different pharmacy for tussionex for sinus bronchitis etc   See above   Cough no better   No fever not like pNA in the past  Blood  In mucous pret reg now  Mixed in phelgm darker   .   No sob today  Remote  Tobacco . Years ago  For 16 years  OSA  For osa.  Under ctr  No dx CLD ROS: See pertinent positives and negatives per HPI.no cp sob  Other bleeding  Past Medical History  Diagnosis Date  . Glaucoma   . Fibroid   . Hypertension   . Hyperlipidemia   . OSA on CPAP   . History of palpitations   . History of echocardiogram 10/16/2011    EF >55%; borderline concentric LVH; mild MR, mild TR    Family History  Problem Relation Age of Onset  . Cancer Mother     Bladder cancer  . Hypertension Sister   . Breast cancer Sister     Age 19  . Cancer Sister     Bladder cancer  . Hypertension Brother   . COPD Brother   . Stroke Maternal Grandmother     History   Social History  . Marital Status: Widowed    Spouse Name: N/A    Number of Children: N/A  . Years of Education: N/A   Social History Main Topics  . Smoking status: Former Smoker    Quit date: 11/26/1980  . Smokeless tobacco: Never Used  . Alcohol Use: No  . Drug Use: No  . Sexual Activity: No   Other Topics Concern  . None   Social History Narrative  . None    Outpatient Encounter Prescriptions as of 09/25/2013  Medication Sig  . bimatoprost (LUMIGAN) 0.03 % ophthalmic solution Place 1 drop into the left eye at  bedtime.  . brimonidine (ALPHAGAN P) 0.1 % SOLN Place 1 drop into the left eye 3 (three) times daily.  . chlorpheniramine-HYDROcodone (TUSSIONEX PENNKINETIC ER) 10-8 MG/5ML LQCR Take 5 mLs by mouth every 12 (twelve) hours as needed.  . cholecalciferol (VITAMIN D) 1000 UNITS tablet Take 1,000 Units by mouth daily.  Marland Kitchen estradiol (ESTRACE) 0.5 MG tablet Take 1 tablet (0.5 mg total) by mouth daily.  . fish oil-omega-3 fatty acids 1000 MG capsule Take 2 g by mouth daily.  . Garlic TABS Take 1 tablet by mouth daily.  . hydrochlorothiazide (HYDRODIURIL) 25 MG tablet Take 1 tablet (25 mg total) by mouth daily.  . methazolamide (NEPTAZANE) 50 MG tablet Take 50 mg by mouth 2 (two) times daily.  . NON FORMULARY CPAP  . Timolol Maleate (ISTALOL) 0.5 % (DAILY) SOLN Apply 1 drop to eye daily. In left eye  . vitamin E 1000 UNIT capsule Take 1,000 Units by mouth daily.  Marland Kitchen amoxicillin-clavulanate (AUGMENTIN) 875-125 MG per tablet Take 1 tablet by mouth 2 (  two) times daily.    EXAM:  BP 122/66  Pulse 81  Temp(Src) 98.2 F (36.8 C) (Oral)  Wt 230 lb (104.327 kg)  BMI 40.1 kg/m2  SpO2 98%  Body mass index is 40.1 kg/(m^2). WDWN in NAD  quiet respirations; mildly congested  somewhat hoarse. Non toxic . HEENT: Normocephalic ;atraumatic , Eyes;  PERRL, EOMs  Full, lids and conjunctiva clear,,Ears: no deformities, canals nl, TM landmarks normal, Nose: no deformity or discharge but congested;face non tender Mouth : OP clear without lesion or edema . Neck: Supple without adenopathy or masses or bruits Chest:  Clear to A&P without wheezes rales or rhonchi CV:  S1-S2 no gallops or murmurs peripheral perfusion is normal Skin :nl perfusion and no acute rashes   PSYCH: pleasant and cooperative, no obvious depression or anxiety  ASSESSMENT AND PLAN:  Discussed the following assessment and plan:  Acute bronchitis - Plan: DG Chest 2 View  Cough with hemoptysis - prob bacterbronchitis   check chest x ray  -  Plan: DG Chest 2 View i snot on antibiotic suspect bacterial infection at this time  Ex tobacco get cx ra y   Expectant management. -Patient advised to return or notify health care team  if symptoms worsen or persist or new concerns arise.  Patient Instructions  Get chest x ray today and will decide on whether to proceed with the Augmentin or a different antibiotic instead.    Neta Mends.  M.D.

## 2013-09-25 NOTE — Patient Instructions (Signed)
Get chest x ray today and will decide on whether to proceed with the Augmentin or a different antibiotic instead.

## 2013-10-01 ENCOUNTER — Other Ambulatory Visit: Payer: Self-pay

## 2014-05-22 ENCOUNTER — Other Ambulatory Visit: Payer: Self-pay | Admitting: Gynecology

## 2014-05-27 ENCOUNTER — Telehealth: Payer: Self-pay | Admitting: *Deleted

## 2014-05-27 NOTE — Telephone Encounter (Signed)
Pt called stating estradiol was denied, pt overdue for annual last seen in dec 2013. I left message on pt voicemail aex Scheduled be scheduled.

## 2014-06-03 ENCOUNTER — Encounter: Payer: Self-pay | Admitting: Women's Health

## 2014-06-03 ENCOUNTER — Ambulatory Visit (INDEPENDENT_AMBULATORY_CARE_PROVIDER_SITE_OTHER): Payer: BC Managed Care – PPO | Admitting: Women's Health

## 2014-06-03 DIAGNOSIS — Z79890 Hormone replacement therapy (postmenopausal): Secondary | ICD-10-CM

## 2014-06-03 MED ORDER — ESTRADIOL 0.5 MG PO TABS: 0.5000 mg | ORAL_TABLET | Freq: Every day | ORAL | Status: DC

## 2014-06-03 NOTE — Patient Instructions (Signed)

## 2014-06-03 NOTE — Progress Notes (Signed)
Patient ID: [name], female   DOB: 24-Mar-1951, 63 y.o.   MRN: [id number] Presents for refill on estrogen. States has been on estrogen since her hysterectomy with oophorectomy in her late 4s. Was last seen in December 2013, former patient Dr. Cherylann Banas, thought she only needed to come in every 3 years. She continues to have hot flushes and would like to continue on estradiol. Has annual schedule with Dr. Toney Rakes.  Exam: Appears well, perspiring.  Postmenopausal with  vasomotor symptoms  Plan: Pros and cons for HRT reviewed risks of blood clots, strokes, breast cancer and accepts. Reviewed best to use less than 10 years. Estradiol 0.5 prescription given, will keep scheduled annual exam with Dr. Toney Rakes.

## 2014-06-15 ENCOUNTER — Ambulatory Visit (INDEPENDENT_AMBULATORY_CARE_PROVIDER_SITE_OTHER): Payer: BC Managed Care – PPO | Admitting: Gynecology

## 2014-06-15 ENCOUNTER — Encounter: Payer: Self-pay | Admitting: Gynecology

## 2014-06-15 VITALS — BP 134/82 | Ht 62.75 in | Wt 219.0 lb

## 2014-06-15 DIAGNOSIS — B373 Candidiasis of vulva and vagina: Secondary | ICD-10-CM

## 2014-06-15 DIAGNOSIS — Z01419 Encounter for gynecological examination (general) (routine) without abnormal findings: Secondary | ICD-10-CM

## 2014-06-15 DIAGNOSIS — N951 Menopausal and female climacteric states: Secondary | ICD-10-CM

## 2014-06-15 DIAGNOSIS — Z79890 Hormone replacement therapy (postmenopausal): Secondary | ICD-10-CM

## 2014-06-15 DIAGNOSIS — Z780 Asymptomatic menopausal state: Secondary | ICD-10-CM

## 2014-06-15 DIAGNOSIS — B3731 Acute candidiasis of vulva and vagina: Secondary | ICD-10-CM

## 2014-06-15 DIAGNOSIS — N898 Other specified noninflammatory disorders of vagina: Secondary | ICD-10-CM

## 2014-06-15 LAB — WET PREP FOR TRICH, YEAST, CLUE
Clue Cells Wet Prep HPF POC: NONE SEEN
Trich, Wet Prep: NONE SEEN

## 2014-06-15 MED ORDER — ESTRADIOL 0.5 MG PO TABS: 0.5000 mg | ORAL_TABLET | Freq: Every day | ORAL | Status: DC

## 2014-06-15 MED ORDER — CLOBETASOL PROPIONATE 0.05 % EX CREA: 1.0000 "application " | TOPICAL_CREAM | Freq: Two times a day (BID) | CUTANEOUS | Status: DC

## 2014-06-15 MED ORDER — FLUCONAZOLE 150 MG PO TABS: 150.0000 mg | ORAL_TABLET | Freq: Once | ORAL | Status: DC

## 2014-06-15 NOTE — Progress Notes (Signed)
[name] [birth year] [id number]   History:    63 y.o.  for annual gyn exam with no complaints today. Patient with prior history of total abdominal hysterectomy with bilateral salpingo-oophorectomy 1981 for uterine fibroids. She has always had normal Pap smears. The last one in 2012. She remains on oral estrogen for menopausal symptoms. She tried a patch but it would not stick.. Patient denies any vaginal bleeding. She has always had normal bone densities the last was normal in 2014. Her colonoscopy was reported to be normal 9 years ago. She does complain at times of pruritus in her groin region.   Past medical history,surgical history, family history and social history were all reviewed and documented in the EPIC chart.  Gynecologic History No LMP recorded. Patient has had a hysterectomy. Contraception: post menopausal status Last Pap: 2012. Results were: normal Last mammogram: 2015. Results were: normal  Obstetric History OB History  Gravida Para Term Preterm AB SAB TAB Ectopic Multiple Living  0                  ROS: A ROS was performed and pertinent positives and negatives are included in the history.  GENERAL: No fevers or chills. HEENT: No change in vision, no earache, sore throat or sinus congestion. NECK: No pain or stiffness. CARDIOVASCULAR: No chest pain or pressure. No palpitations. PULMONARY: No shortness of breath, cough or wheeze. GASTROINTESTINAL: No abdominal pain, nausea, vomiting or diarrhea, melena or bright red blood per rectum. GENITOURINARY: No urinary frequency, urgency, hesitancy or dysuria. MUSCULOSKELETAL: No joint or muscle pain, no back pain, no recent trauma. DERMATOLOGIC: No rash, no itching, no lesions. ENDOCRINE: No polyuria, polydipsia, no heat or cold intolerance. No recent change in weight. HEMATOLOGICAL: No anemia or easy bruising or bleeding. NEUROLOGIC: No headache, seizures, numbness, tingling or weakness. PSYCHIATRIC: No depression, no loss  of interest in normal activity or change in sleep pattern.     Exam: chaperone present  BP 134/82  Ht 5' 2.75" (1.594 m)  Wt 219 lb (99.338 kg)  BMI 39.10 kg/m2  Body mass index is 39.1 kg/(m^2).  General appearance : Well developed well nourished female. No acute distress HEENT: Neck supple, trachea midline, no carotid bruits, no thyroidmegaly Lungs: Clear to auscultation, no rhonchi or wheezes, or rib retractions  Heart: Regular rate and rhythm, no murmurs or gallops Breast:Examined in sitting and supine position were symmetrical in appearance, no palpable masses or tenderness,  no skin retraction, no nipple inversion, no nipple discharge, no skin discoloration, no axillary or supraclavicular lymphadenopathy Abdomen: no palpable masses or tenderness, no rebound or guarding Extremities: no edema or skin discoloration or tenderness  Pelvic:  Bartholin, Urethra, Skene Glands: Within normal limits             Vagina: Thick white discharge  Cervix: Absent  Uterus absent Adnexa  Without masses or tenderness  Anus and perineum  normal   Rectovaginal  normal sphincter tone without palpated masses or tenderness             Hemoccult card provided   Wet prep too numerous to count yeast/hyphae, many WBC and bacteria no clue cells  Assessment/Plan:  63 y.o. female for annual exam who was found to have yeast vaginitis. She will be prescribed Diflucan 150 mg one by mouth. For her occasional growing pruritus she is going to be prescribed clobetasol 0.05% to apply twice a day for 5-7 days when necessary. Pap smear was not done today  in accordance to the new guidelines. We discussed the importance of calcium and vitamin D and regular exercise for osteoporosis prevention. She will schedule for bone density study which was long overdue. Prescription refill for Estrace 0.5 mg 1 by mouth daily was provided. We once again discussed the risks benefits and pros and cons and the findings from the women's  health initiative study. She will need colonoscopy next year.  Note: This dictation was prepared with  Dragon/digital dictation along withSmart phrase technology. Any transcriptional errors that result from this process are unintentional.   Terrance Mass MD, 4:31 PM 06/15/2014

## 2014-06-15 NOTE — Progress Notes (Signed)
Called into pharmacy

## 2014-06-15 NOTE — Patient Instructions (Addendum)
Bone Densitometry Bone densitometry is a special X-ray that measures your bone density and can be used to help predict your risk of bone fractures. This test is used to determine bone mineral content and density to diagnose osteoporosis. Osteoporosis is the loss of bone that may cause the bone to become weak. Osteoporosis commonly occurs in women entering menopause. However, it may be found in men and in people with other diseases. PREPARATION FOR TEST No preparation necessary. WHO SHOULD BE TESTED?  All women older than 46.  Postmenopausal women (50 to 33) with risk factors for osteoporosis.  People with a previous fracture caused by normal activities.  People with a small body frame (less than 127 poundsor a body mass index [BMI] of less than 21).  People who have a parent with a hip fracture or history of osteoporosis.  People who smoke.  People who have rheumatoid arthritis.  Anyone who engages in excessive alcohol use (more than 3 drinks most days).  Women who experience early menopause. WHEN SHOULD YOU BE RETESTED? Current guidelines suggest that you should wait at least 2 years before doing a bone density test again if your first test was normal.Recent studies indicated that women with normal bone density may be able to wait a few years before needing to repeat a bone density test. You should discuss this with your caregiver.  NORMAL FINDINGS   Normal: less than standard deviation below normal (greater than -1).  Osteopenia: 1 to 2.5 standard deviations below normal (-1 to -2.5).  Osteoporosis: greater than 2.5 standard deviations below normal (less than -2.5). Test results are reported as a "T score" and a "Z score."The T score is a number that compares your bone density with the bone density of healthy, young women.The Z score is a number that compares your bone density with the scores of women who are the same age, gender, and race.  Ranges for normal findings may vary  among different laboratories and hospitals. You should always check with your doctor after having lab work or other tests done to discuss the meaning of your test results and whether your values are considered within normal limits. MEANING OF TEST  Your caregiver will go over the test results with you and discuss the importance and meaning of your results, as well as treatment options and the need for additional tests if necessary. OBTAINING THE TEST RESULTS It is your responsibility to obtain your test results. Ask the lab or department performing the test when and how you will get your results. Document Released: 12/04/2004 Document Revised: 02/04/2012 Document Reviewed: 12/27/2010 [facility] Patient Information 2015 [place], [place]. This information is not intended to replace advice given to you by your health care provider. Make sure you discuss any questions you have with your health care provider. Monilial Vaginitis Vaginitis in a soreness, swelling and redness (inflammation) of the vagina and vulva. Monilial vaginitis is not a sexually transmitted infection. CAUSES  Yeast vaginitis is caused by yeast (candida) that is normally found in your vagina. With a yeast infection, the candida has overgrown in number to a point that upsets the chemical balance. SYMPTOMS   White, thick vaginal discharge.  Swelling, itching, redness and irritation of the vagina and possibly the lips of the vagina (vulva).  Burning or painful urination.  Painful intercourse. DIAGNOSIS  Things that may contribute to monilial vaginitis are:  Postmenopausal and virginal states.  Pregnancy.  Infections.  Being tired, sick or stressed, especially if you had monilial vaginitis  in the past.  Diabetes. Good control will help lower the chance.  Birth control pills.  Tight fitting garments.  Using bubble bath, feminine sprays, douches or deodorant tampons.  Taking certain medications that kill germs  (antibiotics).  Sporadic recurrence can occur if you become ill. TREATMENT  Your caregiver will give you medication.  There are several kinds of anti monilial vaginal creams and suppositories specific for monilial vaginitis. For recurrent yeast infections, use a suppository or cream in the vagina 2 times a week, or as directed.  Anti-monilial or steroid cream for the itching or irritation of the vulva may also be used. Get your caregiver's permission.  Painting the vagina with methylene blue solution may help if the monilial cream does not work.  Eating yogurt may help prevent monilial vaginitis. HOME CARE INSTRUCTIONS   Finish all medication as prescribed.  Do not have sex until treatment is completed or after your caregiver tells you it is okay.  Take warm sitz baths.  Do not douche.  Do not use tampons, especially scented ones.  Wear cotton underwear.  Avoid tight pants and panty hose.  Tell your sexual partner that you have a yeast infection. They should go to their caregiver if they have symptoms such as mild rash or itching.  Your sexual partner should be treated as well if your infection is difficult to eliminate.  Practice safer sex. Use condoms.  Some vaginal medications cause latex condoms to fail. Vaginal medications that harm condoms are:  Cleocin cream.  Butoconazole (Femstat).  Terconazole (Terazol) vaginal suppository.  Miconazole (Monistat) (may be purchased over the counter). SEEK MEDICAL CARE IF:   You have a temperature by mouth above 102 F (38.9 C).  The infection is getting worse after 2 days of treatment.  The infection is not getting better after 3 days of treatment.  You develop blisters in or around your vagina.  You develop vaginal bleeding, and it is not your menstrual period.  You have pain when you urinate.  You develop intestinal problems.  You have pain with sexual intercourse. Document Released: 08/22/2005 Document  Revised: 02/04/2012 Document Reviewed: 05/06/2009 [facility] Patient Information 2015 [place], [place]. This information is not intended to replace advice given to you by your health care provider. Make sure you discuss any questions you have with your health care provider.

## 2014-06-16 ENCOUNTER — Telehealth: Payer: Self-pay | Admitting: *Deleted

## 2014-06-16 NOTE — Telephone Encounter (Signed)
Faxed signed orders for sleep apnea supplies to choice

## 2014-07-06 ENCOUNTER — Ambulatory Visit (INDEPENDENT_AMBULATORY_CARE_PROVIDER_SITE_OTHER): Payer: BC Managed Care – PPO

## 2014-07-06 ENCOUNTER — Other Ambulatory Visit: Payer: Self-pay | Admitting: Gynecology

## 2014-07-06 DIAGNOSIS — Z780 Asymptomatic menopausal state: Secondary | ICD-10-CM

## 2014-07-06 DIAGNOSIS — N951 Menopausal and female climacteric states: Secondary | ICD-10-CM

## 2014-07-06 DIAGNOSIS — Z13820 Encounter for screening for osteoporosis: Secondary | ICD-10-CM

## 2014-07-06 DIAGNOSIS — Z79890 Hormone replacement therapy (postmenopausal): Secondary | ICD-10-CM

## 2014-09-01 ENCOUNTER — Other Ambulatory Visit: Payer: Self-pay | Admitting: Family Medicine

## 2014-09-14 ENCOUNTER — Ambulatory Visit (INDEPENDENT_AMBULATORY_CARE_PROVIDER_SITE_OTHER): Payer: BC Managed Care – PPO | Admitting: Family Medicine

## 2014-09-14 ENCOUNTER — Encounter: Payer: Self-pay | Admitting: Family Medicine

## 2014-09-14 VITALS — BP 120/80 | Temp 98.3°F | Wt 224.0 lb

## 2014-09-14 DIAGNOSIS — Z111 Encounter for screening for respiratory tuberculosis: Secondary | ICD-10-CM

## 2014-09-14 DIAGNOSIS — Z0000 Encounter for general adult medical examination without abnormal findings: Secondary | ICD-10-CM

## 2014-09-14 NOTE — Patient Instructions (Signed)
Return this fall for your physical examination

## 2014-09-14 NOTE — Progress Notes (Signed)
Pre visit review using our clinic review tool, if applicable. No additional management support is needed unless otherwise documented below in the visit note. 

## 2014-09-14 NOTE — Progress Notes (Signed)
   Subjective:    Patient ID: [name], female    DOB: 06-Jul-1951, 63 y.o.   MRN: [id number]  HPI  [name] is a  63 year old female nonsmoker who works at [employer]. She comes in today to get her form filled out to work.  She is also due for physical examination which she will set up for this fall  Review of Systems    review of systems negative Objective:   Physical Exam  Well-developed well-nourished female in acute distress vital signs stable she is afebrile      Assessment & Plan:  Form filled out for school............ TB skin test given read in 48 hours...Marland KitchenMarland KitchenMarland Kitchen set up CPX this fall ...Marland KitchenMarland KitchenMarland Kitchen

## 2014-09-16 LAB — TB SKIN TEST
Induration: 0 mm
TB SKIN TEST: NEGATIVE

## 2014-11-25 ENCOUNTER — Other Ambulatory Visit: Payer: Self-pay | Admitting: Family Medicine

## 2015-02-16 ENCOUNTER — Encounter: Payer: Self-pay | Admitting: Gastroenterology

## 2015-03-30 ENCOUNTER — Ambulatory Visit (AMBULATORY_SURGERY_CENTER): Payer: Self-pay

## 2015-03-30 VITALS — Ht 63.0 in | Wt 216.0 lb

## 2015-03-30 DIAGNOSIS — Z1211 Encounter for screening for malignant neoplasm of colon: Secondary | ICD-10-CM

## 2015-03-30 MED ORDER — MOVIPREP 100 G PO SOLR: 1.0000 | Freq: Once | ORAL | Status: DC

## 2015-03-30 NOTE — Progress Notes (Signed)
No allergies to eggs or osy No home oxygen No diet/weight loss meds No past problems with anesthesia

## 2015-03-31 ENCOUNTER — Encounter: Payer: Self-pay | Admitting: Gastroenterology

## 2015-04-13 ENCOUNTER — Ambulatory Visit (AMBULATORY_SURGERY_CENTER): Payer: BLUE CROSS/BLUE SHIELD | Admitting: Gastroenterology

## 2015-04-13 ENCOUNTER — Encounter: Payer: Self-pay | Admitting: Gastroenterology

## 2015-04-13 ENCOUNTER — Other Ambulatory Visit: Payer: Self-pay | Admitting: Gastroenterology

## 2015-04-13 VITALS — BP 150/96 | HR 62 | Temp 98.3°F | Resp 36 | Ht 63.0 in | Wt 216.0 lb

## 2015-04-13 DIAGNOSIS — D125 Benign neoplasm of sigmoid colon: Secondary | ICD-10-CM | POA: Diagnosis not present

## 2015-04-13 DIAGNOSIS — Z1211 Encounter for screening for malignant neoplasm of colon: Secondary | ICD-10-CM | POA: Diagnosis not present

## 2015-04-13 DIAGNOSIS — D122 Benign neoplasm of ascending colon: Secondary | ICD-10-CM | POA: Diagnosis not present

## 2015-04-13 MED ORDER — SODIUM CHLORIDE 0.9 % IV SOLN: 500.0000 mL | INTRAVENOUS | Status: DC

## 2015-04-13 NOTE — Op Note (Signed)
[place]  [address] [place], [id number]   COLONOSCOPY PROCEDURE REPORT  PATIENT: Kimberly, Chase  MR#: [id number] BIRTHDATE: 1951/10/13 , 63  yrs. old GENDER: female ENDOSCOPIST: Milus Banister, MD PROCEDURE DATE:  04/13/2015 PROCEDURE:   Colonoscopy, screening and Colonoscopy with snare polypectomy First Screening Colonoscopy - Avg.  risk and is 50 yrs.  old or older - No.  Prior Negative Screening - Now for repeat screening. 10 or more years since last screening  History of Adenoma - Now for follow-up colonoscopy & has been > or = to 3 yrs.  N/A  Polyps removed today? Yes ASA CLASS:   Class II INDICATIONS:Screening for colonic neoplasia and Colorectal Neoplasm Risk Assessment for this procedure is average risk. MEDICATIONS: Monitored anesthesia care and Propofol 250 mg IV  DESCRIPTION OF PROCEDURE:   After the risks benefits and alternatives of the procedure were thoroughly explained, informed consent was obtained.  The digital rectal exam revealed no abnormalities of the rectum.   The LB [id number]  endoscope was introduced through the anus and advanced to the cecum, which was identified by both the appendix and ileocecal valve. No adverse events experienced.   The quality of the prep was excellent.  The instrument was then slowly withdrawn as the colon was fully examined. Estimated blood loss is zero unless otherwise noted in this procedure report.  COLON FINDINGS: Two  polyps ranging between 3-59mm in size were found in the sigmoid colon and ascending colon.  Polypectomies were performed.  The largest (in ascending segment) was pedunculated, removed with snare/cautery.  The other polyp was 50mm, sessile, located in sigmoid, removed with cold snare.  The resections werecomplete, the polyp tissue was completely retrieved and sent to histology.   There was mild diverticulosis noted throughout the entire examined colon.   The examination was  otherwise normal. Retroflexed views revealed no abnormalities. The time to cecum = 2.6 Withdrawal time = 9.0   The scope was withdrawn and the procedure completed. COMPLICATIONS: There were no immediate complications.  ENDOSCOPIC IMPRESSION: 1.  Two  polyps were found, removed and sent to pathology. 2.   Mild diverticulosis was noted throughout the entire examined colon 3.   The examination was otherwise normal  RECOMMENDATIONS: If the polyp(s) removed today are proven to be adenomatous (pre-cancerous) polyps, you will need a repeat colonoscopy in 5 years.  Otherwise you should continue to follow colorectal cancer screening guidelines for "routine risk" patients with colonoscopy in 10 years.  You will receive a letter within 1-2 weeks with the results of your biopsy as well as final recommendations.  Please call my office if you have not received a letter after 3 weeks.  eSigned:  Milus Banister, MD 04/13/2015 11:14 AM

## 2015-04-13 NOTE — Progress Notes (Signed)
Called to room to assist during endoscopic procedure.  Patient ID and intended procedure confirmed with present staff. Received instructions for my participation in the procedure from the performing physician.  

## 2015-04-13 NOTE — Patient Instructions (Signed)
YOU HAD AN ENDOSCOPIC PROCEDURE TODAY AT [facility]:   Refer to the procedure report that was given to you for any specific questions about what was found during the examination.  If the procedure report does not answer your questions, please call your gastroenterologist to clarify.  If you requested that your care partner not be given the details of your procedure findings, then the procedure report has been included in a sealed envelope for you to review at your convenience later.  YOU SHOULD EXPECT: Some feelings of bloating in the abdomen. Passage of more gas than usual.  Walking can help get rid of the air that was put into your GI tract during the procedure and reduce the bloating. If you had a lower endoscopy (such as a colonoscopy or flexible sigmoidoscopy) you may notice spotting of blood in your stool or on the toilet paper. If you underwent a bowel prep for your procedure, you may not have a normal bowel movement for a few days.  Please Note:  You might notice some irritation and congestion in your nose or some drainage.  This is from the oxygen used during your procedure.  There is no need for concern and it should clear up in a day or so.  SYMPTOMS TO REPORT IMMEDIATELY:   Following lower endoscopy (colonoscopy or flexible sigmoidoscopy):  Excessive amounts of blood in the stool  Significant tenderness or worsening of abdominal pains  Swelling of the abdomen that is new, acute  Fever of 100F or higher   For urgent or emergent issues, a gastroenterologist can be reached at any hour by calling [phone].   DIET: Your first meal following the procedure should be a small meal and then it is ok to progress to your normal diet. Heavy or fried foods are harder to digest and may make you feel nauseous or bloated.  Likewise, meals heavy in dairy and vegetables can increase bloating.  Drink plenty of fluids but you should avoid alcoholic beverages for 24  hours.  ACTIVITY:  You should plan to take it easy for the rest of today and you should NOT DRIVE or use heavy machinery until tomorrow (because of the sedation medicines used during the test).    FOLLOW UP: Our staff will call the number listed on your records the next business day following your procedure to check on you and address any questions or concerns that you may have regarding the information given to you following your procedure. If we do not reach you, we will leave a message.  However, if you are feeling well and you are not experiencing any problems, there is no need to return our call.  We will assume that you have returned to your regular daily activities without incident.  If any biopsies were taken you will be contacted by phone or by letter within the next 1-3 weeks.  Please call us at [phone] if you have not heard about the biopsies in 3 weeks.    SIGNATURES/CONFIDENTIALITY: You and/or your care partner have signed paperwork which will be entered into your electronic medical record.  These signatures attest to the fact that that the information above on your After Visit Summary has been reviewed and is understood.  Full responsibility of the confidentiality of this discharge information lies with you and/or your care-partner.  Please review polyp, diverticulosis, and high fiber diet handouts provided. Next colonoscopy determined by pathology results; 5 or 10 years.

## 2015-04-13 NOTE — Progress Notes (Signed)
A/ox3 pleased with MAC, report to Wendy RN 

## 2015-04-14 ENCOUNTER — Telehealth: Payer: Self-pay

## 2015-04-14 NOTE — Telephone Encounter (Signed)
Left message on answering machine. 

## 2015-04-19 ENCOUNTER — Encounter: Payer: Self-pay | Admitting: Gastroenterology

## 2015-04-29 NOTE — Addendum Note (Signed)
Addended by: Steva Ready on: 04/29/2015 11:32 AM   Modules accepted: Level of Service

## 2015-05-01 ENCOUNTER — Other Ambulatory Visit: Payer: Self-pay | Admitting: Ophthalmology

## 2015-05-01 MED ORDER — TETRACAINE HCL 0.5 % OP SOLN: 1.0000 [drp] | OPHTHALMIC | Status: AC

## 2015-05-01 NOTE — H&P (Signed)
History & Physical:   DATE:   04-21-2015  NAME:  Nahlia, Hellmann     [id number]       HISTORY OF PRESENT ILLNESS: Chief Eye Complaints   Glaucoma  patient: Patient states eyes are very tired all the time especially while driving.   HPI: EYES: Reports symptoms of eye sensation problems.      LOCATION:   BOTH EYES        QUALITY/COURSE:   Reports condition is changing.       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 2013   MEDICATIONS: Ocuflox: Strength-  SIG-  OS bid    Lumigan: 0.01% solution SIG-  1 drop OD qhs    Betagan: Strength-  SIG-  1 gtt OD qd    Neptazane: 50 mg tablet SIG-  2 tab(s) orally 2 times a day for 30 days   Alphagan P: 0.1% solution SIG-  1 gtt OU TID  REVIEW OF SYSTEMS: ROS:   GEN- Constitutional: HENT: GEN - Endocrine: Reports symptoms of LUNGS/Respiratory:  HEART/Cardiovascular: Reports symptoms of hypertension ABD/Gastrointestinal:  Musculoskeletal (BJE): NEURO/Neurological: PSYCH/Psychiatric:    Is the pt oriented to time, place, person? yes Mood normal   TOBACCO: Never smoker   ICD10: Y63.785 ICD9: V13.89   SOCIAL HISTORY:] Widow.  FAMILY HISTORY:  Positive family history for  -  Glaucoma Diabetes, HTN Both sides of family with Diabetes    Brother/ Sister HTN UNCLES/AUNTS: Aunt, Cousin, 2 Uncles,  Glaucoma  Aunt Diabetes/HTN Family History - 1st Degree Relatives:  Mother dead.   Family History - 1st Degree Relatives:  ALLERGIES: Pilocarpine (Akarpine, Pilocar) Ophthalmic Solution:     NOT EFFECTIVE   Drug Allergies.  No Known.     PHYSICAL EXAMINATION: VS: BMI: 45.0.  BP: 120/74.  H: 60.00 in.  P: 72 /min.   RR: 16 /min.  W: 230lbs 0oz.   Exam: GENERAL: Appearance: General appearance can be described as well-nourished, well-developed, and in no acute distress.        Va  OD: CC 20/40+1      ph NI OS: CC 20/25-  ph  phni     OU 20/20 -2  EYEGLASSES:  OD: -2.00 +2.00 x 004  OS:  -2.00 +1.00 x 178 ADD:  +1.75   MR  03/22/2015 17:04   OD:  -1.75 +1.50 x008 20/40  OS:  -1.25 +1.25 x019 20/25 ADD  +2.50  VF:   OD: full in all four quadrants  OS: full in all four quadrants  PUPILS: 57mm normal reaction/OS reactive   Motility full versions  cover test no shift  EYELIDS & OCULAR ADNEXA: ptosis OD   SLE: Conjunctiva: OD: low thick bleb - siedel, diffuse injection +1-2 YI:FOYDX bleb sutures intact - siedel, , conjuctival w  exposed tube & silk suture    Cornea:  OU: Arcus/ decrease tear film, staining  Anterior Chamber:   OU  deep & quiet OU    Iris: AJ:OINOM  PERIPHERAL  IRIDECTOMY  UV:OZDGUYQI PAS  Lens: HK:VQQVZDGLO Chamber  Intraocular Lens Implant    OS: posterior chamber  intraocular lens implant  w/KP    Ta   in mmH        OD:    24    handheld 24 after DP 22 OS:    16 handheld  Time  Gonio:OD internal sclerostomy open  Dilation:  Fundus:  optic nerve OD:90% cup & temp pallor                                               OS: 90% cup, temp pallor   Macula       OD: nl                                                    OS: nl  Vessels:nl  Periphery nl  HRT MRA ABN OU    OD c/d 0.86   OS c/d 0.95   Humphrey visual feel right eye reveals  dense and inferior arcuate defect mean deviation -13 to -10 eye of appears stable compared test conductive 2013     Exam: GENERAL: Appearance: HEAD, EARS, NOSE AND THROAT: Ears-Nose (external) Inspection: Externally, nose and ears are normal in appearance and without scars, lesions, or nodules.      Hearing assessment shows no problems with normal conversation.      LUNGS and RESPIRATORY: Lung auscultation  elicits no wheezing, rhonci, rales or rubs and with equal breath sounds.    Respiratory effort described as breathing is unlabored and chest movement is symmetrical.    HEART (Cardiovascular): Heart auscultation discovers regular rate and rhythm; no murmur, gallop or rub. Normal heart sounds.    ABDOMEN (Gastrointestinal): Mass/Tenderness Exam: Neither are present.     MUSCULOSKELETAL (BJE): Inspection-Palpation: No major bone, joint, tendon, or muscle changes.      NEUROLOGICAL: Alert and oriented. No major deficits of coordination or sensation.      PSYCHIATRIC: Insight and judgment appear  both to be intact and appropriate.    Mood and affect are described as normal mood and full affect.    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    ___________________________ Marylynn Pearson, Lauris Chroman - Inactive Problems:

## 2015-05-02 ENCOUNTER — Telehealth: Payer: Self-pay | Admitting: *Deleted

## 2015-05-02 NOTE — Telephone Encounter (Signed)
Faxed order for PAP tubing, PAP headgear, PAP water chamber for humidifier to [facility]

## 2015-05-03 MED ORDER — GATIFLOXACIN 0.5 % OP SOLN
1.0000 [drp] | OPHTHALMIC | Status: AC | PRN
  Administered 2015-05-04 (×3): 1 [drp] via OPHTHALMIC

## 2015-05-03 MED ORDER — PREDNISOLONE ACETATE 1 % OP SUSP
1.0000 [drp] | OPHTHALMIC | Status: AC
  Administered 2015-05-04: 1 [drp] via OPHTHALMIC

## 2015-05-04 ENCOUNTER — Ambulatory Visit (HOSPITAL_COMMUNITY)
Admission: RE | Admit: 2015-05-04 | Discharge: 2015-05-04 | Disposition: A | Discharge status: Home / Self Care | Payer: BLUE CROSS/BLUE SHIELD | Source: Ambulatory Visit | Attending: Ophthalmology | Admitting: Ophthalmology

## 2015-05-04 ENCOUNTER — Encounter (HOSPITAL_COMMUNITY): Payer: Self-pay | Admitting: Anesthesiology

## 2015-05-04 ENCOUNTER — Ambulatory Visit (HOSPITAL_COMMUNITY): Payer: BLUE CROSS/BLUE SHIELD | Admitting: Anesthesiology

## 2015-05-04 ENCOUNTER — Other Ambulatory Visit: Payer: Self-pay

## 2015-05-04 ENCOUNTER — Encounter (HOSPITAL_COMMUNITY)
Admission: RE | Disposition: A | Discharge status: Home / Self Care | Payer: Self-pay | Source: Ambulatory Visit | Attending: Ophthalmology

## 2015-05-04 DIAGNOSIS — Y92234 Operating room of hospital as the place of occurrence of the external cause: Secondary | ICD-10-CM | POA: Diagnosis not present

## 2015-05-04 DIAGNOSIS — G4730 Sleep apnea, unspecified: Secondary | ICD-10-CM | POA: Insufficient documentation

## 2015-05-04 DIAGNOSIS — Z888 Allergy status to other drugs, medicaments and biological substances status: Secondary | ICD-10-CM | POA: Insufficient documentation

## 2015-05-04 DIAGNOSIS — Y838 Other surgical procedures as the cause of abnormal reaction of the patient, or of later complication, without mention of misadventure at the time of the procedure: Secondary | ICD-10-CM | POA: Insufficient documentation

## 2015-05-04 DIAGNOSIS — H4011X3 Primary open-angle glaucoma, severe stage: Secondary | ICD-10-CM | POA: Diagnosis not present

## 2015-05-04 DIAGNOSIS — T8131XA Disruption of external operation (surgical) wound, not elsewhere classified, initial encounter: Secondary | ICD-10-CM | POA: Diagnosis present

## 2015-05-04 HISTORY — DX: Cardiac murmur, unspecified: R01.1

## 2015-05-04 HISTORY — DX: Unspecified osteoarthritis, unspecified site: M19.90

## 2015-05-04 HISTORY — PX: INSERTION OF AHMED VALVE: SHX6254

## 2015-05-04 HISTORY — DX: Gastro-esophageal reflux disease without esophagitis: K21.9

## 2015-05-04 LAB — BASIC METABOLIC PANEL
ANION GAP: 9 (ref 5–15)
BUN: 14 mg/dL (ref 6–20)
CHLORIDE: 103 mmol/L (ref 101–111)
CO2: 26 mmol/L (ref 22–32)
Calcium: 9.7 mg/dL (ref 8.9–10.3)
Creatinine, Ser: 0.98 mg/dL (ref 0.44–1.00)
GFR calc non Af Amer: >60 mL/min (ref >60)
Glucose, Bld: 101 mg/dL — ABNORMAL HIGH (ref 65–99)
POTASSIUM: 3.8 mmol/L (ref 3.5–5.1)
Sodium: 138 mmol/L (ref 135–145)

## 2015-05-04 LAB — CBC
HCT: 40 % (ref 36.0–46.0)
Hemoglobin: 12.9 g/dL (ref 12.0–15.0)
MCH: 28.7 pg (ref 26.0–34.0)
MCHC: 32.3 g/dL (ref 30.0–36.0)
MCV: 88.9 fL (ref 78.0–100.0)
Platelets: 196 10*3/uL (ref 150–400)
RBC: 4.5 MIL/uL (ref 3.87–5.11)
RDW: 12.2 % (ref 11.5–15.5)
WBC: 4.2 10*3/uL (ref 4.0–10.5)

## 2015-05-04 SURGERY — INSERTION, GLAUCOMA VALVE, AHMED
Anesthesia: Monitor Anesthesia Care | Site: Eye | Laterality: Left

## 2015-05-04 MED ORDER — LIDOCAINE HCL 2 % IJ SOLN
INTRAMUSCULAR | Status: AC
  Filled 2015-05-04: qty 20

## 2015-05-04 MED ORDER — TETRACAINE HCL 0.5 % OP SOLN
OPHTHALMIC | Status: AC
  Filled 2015-05-04: qty 2

## 2015-05-04 MED ORDER — BSS IO SOLN
INTRAOCULAR | Status: DC | PRN
  Administered 2015-05-04 (×2): 15 mL via INTRAOCULAR

## 2015-05-04 MED ORDER — ONDANSETRON HCL 4 MG/2ML IJ SOLN
INTRAMUSCULAR | Status: DC | PRN
  Administered 2015-05-04: 4 mg via INTRAVENOUS

## 2015-05-04 MED ORDER — HYALURONIDASE HUMAN 150 UNIT/ML IJ SOLN
INTRAMUSCULAR | Status: AC
  Filled 2015-05-04: qty 1

## 2015-05-04 MED ORDER — 0.9 % SODIUM CHLORIDE (POUR BTL) OPTIME
TOPICAL | Status: DC | PRN
  Administered 2015-05-04: 100 mL

## 2015-05-04 MED ORDER — BSS IO SOLN
INTRAOCULAR | Status: AC
  Filled 2015-05-04: qty 15

## 2015-05-04 MED ORDER — SODIUM HYALURONATE 10 MG/ML IO SOLN
INTRAOCULAR | Status: AC
  Filled 2015-05-04: qty 0.85

## 2015-05-04 MED ORDER — LIDOCAINE-EPINEPHRINE 2 %-1:100000 IJ SOLN
INTRAMUSCULAR | Status: DC | PRN
  Administered 2015-05-04: 7 mL via RETROBULBAR

## 2015-05-04 MED ORDER — STERILE WATER FOR INJECTION IJ SOLN
INTRAMUSCULAR | Status: AC
  Filled 2015-05-04: qty 10

## 2015-05-04 MED ORDER — TRIAMCINOLONE ACETONIDE 40 MG/ML IJ SUSP
INTRAMUSCULAR | Status: AC
  Filled 2015-05-04: qty 5

## 2015-05-04 MED ORDER — MIDAZOLAM HCL 5 MG/5ML IJ SOLN
INTRAMUSCULAR | Status: DC | PRN
  Administered 2015-05-04 (×2): 1 mg via INTRAVENOUS

## 2015-05-04 MED ORDER — PROPOFOL 10 MG/ML IV BOLUS
INTRAVENOUS | Status: DC | PRN
  Administered 2015-05-04: 20 mg via INTRAVENOUS
  Administered 2015-05-04: 30 mg via INTRAVENOUS

## 2015-05-04 MED ORDER — PROPOFOL 10 MG/ML IV BOLUS
INTRAVENOUS | Status: AC
  Filled 2015-05-04: qty 20

## 2015-05-04 MED ORDER — FENTANYL CITRATE (PF) 250 MCG/5ML IJ SOLN
INTRAMUSCULAR | Status: DC | PRN
  Administered 2015-05-04 (×2): 25 ug via INTRAVENOUS
  Administered 2015-05-04: 50 ug via INTRAVENOUS

## 2015-05-04 MED ORDER — EPHEDRINE SULFATE 50 MG/ML IJ SOLN
INTRAMUSCULAR | Status: AC
  Filled 2015-05-04: qty 1

## 2015-05-04 MED ORDER — ROCURONIUM BROMIDE 50 MG/5ML IV SOLN
INTRAVENOUS | Status: AC
  Filled 2015-05-04: qty 1

## 2015-05-04 MED ORDER — LIDOCAINE HCL (CARDIAC) 20 MG/ML IV SOLN
INTRAVENOUS | Status: DC | PRN
  Administered 2015-05-04: 25 mg via INTRAVENOUS

## 2015-05-04 MED ORDER — DEXAMETHASONE SODIUM PHOSPHATE 4 MG/ML IJ SOLN
INTRAMUSCULAR | Status: DC | PRN
  Administered 2015-05-04: 4 mg via INTRAVENOUS

## 2015-05-04 MED ORDER — SUCCINYLCHOLINE CHLORIDE 20 MG/ML IJ SOLN
INTRAMUSCULAR | Status: AC
  Filled 2015-05-04: qty 1

## 2015-05-04 MED ORDER — ONDANSETRON HCL 4 MG/2ML IJ SOLN
INTRAMUSCULAR | Status: AC
  Filled 2015-05-04: qty 2

## 2015-05-04 MED ORDER — PREDNISOLONE ACETATE 1 % OP SUSP
OPHTHALMIC | Status: DC
Start: 2015-05-04 — End: 2015-05-04
  Filled 2015-05-04: qty 5

## 2015-05-04 MED ORDER — ATROPINE SULFATE 1 % OP SOLN
OPHTHALMIC | Status: AC
  Filled 2015-05-04: qty 5

## 2015-05-04 MED ORDER — GATIFLOXACIN 0.5 % OP SOLN
OPHTHALMIC | Status: AC
  Filled 2015-05-04: qty 2.5

## 2015-05-04 MED ORDER — ACETYLCHOLINE CHLORIDE 20 MG IO SOLR
INTRAOCULAR | Status: AC
  Filled 2015-05-04: qty 1

## 2015-05-04 MED ORDER — EPINEPHRINE HCL 1 MG/ML IJ SOLN
INTRAMUSCULAR | Status: AC
  Filled 2015-05-04: qty 1

## 2015-05-04 MED ORDER — GLYCOPYRROLATE 0.2 MG/ML IJ SOLN
INTRAMUSCULAR | Status: AC
  Filled 2015-05-04: qty 3

## 2015-05-04 MED ORDER — PILOCARPINE HCL 4 % OP SOLN
OPHTHALMIC | Status: AC
  Filled 2015-05-04: qty 15

## 2015-05-04 MED ORDER — BUPIVACAINE HCL (PF) 0.75 % IJ SOLN
INTRAMUSCULAR | Status: AC
  Filled 2015-05-04: qty 10

## 2015-05-04 MED ORDER — TOBRAMYCIN-DEXAMETHASONE 0.3-0.1 % OP OINT
TOPICAL_OINTMENT | OPHTHALMIC | Status: AC
  Filled 2015-05-04: qty 3.5

## 2015-05-04 MED ORDER — LIDOCAINE HCL (CARDIAC) 20 MG/ML IV SOLN
INTRAVENOUS | Status: AC
  Filled 2015-05-04: qty 5

## 2015-05-04 MED ORDER — FENTANYL CITRATE (PF) 250 MCG/5ML IJ SOLN
INTRAMUSCULAR | Status: AC
  Filled 2015-05-04: qty 5

## 2015-05-04 MED ORDER — BSS IO SOLN
INTRAOCULAR | Status: AC
  Filled 2015-05-04: qty 500

## 2015-05-04 MED ORDER — SODIUM HYALURONATE 10 MG/ML IO SOLN
INTRAOCULAR | Status: DC | PRN
  Administered 2015-05-04: 0.85 mL via INTRAOCULAR

## 2015-05-04 MED ORDER — MIDAZOLAM HCL 2 MG/2ML IJ SOLN
INTRAMUSCULAR | Status: AC
  Filled 2015-05-04: qty 2

## 2015-05-04 MED ORDER — TOBRAMYCIN 0.3 % OP OINT
TOPICAL_OINTMENT | OPHTHALMIC | Status: DC | PRN
  Administered 2015-05-04: 1 via OPHTHALMIC

## 2015-05-04 MED ORDER — LIDOCAINE-EPINEPHRINE 2 %-1:100000 IJ SOLN
INTRAMUSCULAR | Status: AC
  Filled 2015-05-04: qty 1

## 2015-05-04 MED ORDER — SODIUM CHLORIDE 0.9 % IV SOLN
INTRAVENOUS | Status: DC | PRN
  Administered 2015-05-04: 09:00:00 via INTRAVENOUS

## 2015-05-04 SURGICAL SUPPLY — 53 items
ALLOGRAFT TUTOPLAST 1.5X1.5 (Ophthalmic Related) IMPLANT
AMNIOGRAFT 2.5X2.0 (Orthopedic Implant) ×1 IMPLANT
APL SRG 3 HI ABS STRL LF PLS (MISCELLANEOUS) ×1
APPLICATOR COTTON TIP 6IN STRL (MISCELLANEOUS) IMPLANT
APPLICATOR DR MATTHEWS STRL (MISCELLANEOUS) ×2 IMPLANT
BLADE EYE CATARACT 19 1.4 BEAV (BLADE) ×1 IMPLANT
BLADE STAB KNIFE 45DEG (BLADE) IMPLANT
BLADE SURG 15 STRL LF DISP TIS (BLADE) IMPLANT
BLADE SURG 15 STRL SS (BLADE)
CANISTER SUCTION 2500CC (MISCELLANEOUS) IMPLANT
CORDS BIPOLAR (ELECTRODE) ×2 IMPLANT
DRAPE OPHTHALMIC 40X48 W POUCH (DRAPES) ×2 IMPLANT
DRAPE RETRACTOR (MISCELLANEOUS) ×2 IMPLANT
ERASER HMR WETFIELD 23G BP (MISCELLANEOUS) IMPLANT
GLOVE BIO SURGEON STRL SZ8 (GLOVE) ×2 IMPLANT
GLOVE BIO SURGEON STRL SZ8.5 (GLOVE) ×1 IMPLANT
GLOVE BIOGEL PI IND STRL 7.0 (GLOVE) IMPLANT
GLOVE BIOGEL PI INDICATOR 7.0 (GLOVE) ×1
GLOVE ECLIPSE 7.0 STRL STRAW (GLOVE) ×1 IMPLANT
GLOVE SURG SS PI 6.5 STRL IVOR (GLOVE) ×1 IMPLANT
GLOVE SURG SS PI 7.0 STRL IVOR (GLOVE) ×1 IMPLANT
GOWN STRL REUS W/ TWL LRG LVL3 (GOWN DISPOSABLE) ×2 IMPLANT
GOWN STRL REUS W/ TWL XL LVL3 (GOWN DISPOSABLE) ×1 IMPLANT
GOWN STRL REUS W/TWL LRG LVL3 (GOWN DISPOSABLE) ×2
GOWN STRL REUS W/TWL XL LVL3 (GOWN DISPOSABLE) ×2
KIT BASIN OR (CUSTOM PROCEDURE TRAY) ×2 IMPLANT
KIT ROOM TURNOVER OR (KITS) ×2 IMPLANT
KNIFE GRIESHABER SHARP 2.5MM (MISCELLANEOUS) ×2 IMPLANT
MARKER SKIN DUAL TIP RULER LAB (MISCELLANEOUS) ×2 IMPLANT
NDL 25GX 5/8IN NON SAFETY (NEEDLE) ×1 IMPLANT
NDL HYPO 30X.5 LL (NEEDLE) ×1 IMPLANT
NEEDLE 22X1 1/2 (OR ONLY) (NEEDLE) ×2 IMPLANT
NEEDLE 25GX 5/8IN NON SAFETY (NEEDLE) ×4 IMPLANT
NEEDLE HYPO 30X.5 LL (NEEDLE) ×2 IMPLANT
NS IRRIG 1000ML POUR BTL (IV SOLUTION) ×2 IMPLANT
PACK CATARACT CUSTOM (CUSTOM PROCEDURE TRAY) ×2 IMPLANT
PAD ARMBOARD 7.5X6 YLW CONV (MISCELLANEOUS) ×3 IMPLANT
SPEAR EYE SURG WECK-CEL (MISCELLANEOUS) ×4 IMPLANT
SPECIMEN JAR SMALL (MISCELLANEOUS) IMPLANT
SUT ETHILON 10 0 CS140 6 (SUTURE) IMPLANT
SUT ETHILON 9 0 BV100 4 (SUTURE) ×2 IMPLANT
SUT ETHILON 9 0 TG140 8 (SUTURE) ×1 IMPLANT
SUT MERSILENE 6 0 S14 DA (SUTURE) IMPLANT
SUT SILK 6 0 G 6 (SUTURE) ×2 IMPLANT
SUT VICRYL 8 0 TG140 8 (SUTURE) IMPLANT
SUT VICRYL 9-0 (SUTURE) ×2 IMPLANT
SYR 50ML SLIP (SYRINGE) ×2 IMPLANT
SYR TB 1ML LUER SLIP (SYRINGE) ×4 IMPLANT
TOWEL OR 17X24 6PK STRL BLUE (TOWEL DISPOSABLE) ×4 IMPLANT
TUBE CONNECTING 12X1/4 (SUCTIONS) IMPLANT
TUTOPLAST 1.5X1.5 (Ophthalmic Related) ×2 IMPLANT
WATER STERILE IRR 1000ML POUR (IV SOLUTION) ×2 IMPLANT
WIPE INSTRUMENT VISIWIPE 73X73 (MISCELLANEOUS) ×3 IMPLANT

## 2015-05-04 NOTE — H&P (View-Only) (Signed)
History & Physical:   DATE:   04-21-2015  NAME:  [name], [name]     [id number]       HISTORY OF PRESENT ILLNESS: Chief Eye Complaints   Glaucoma  patient: Patient states eyes are very tired all the time especially while driving.   HPI: EYES: Reports symptoms of eye sensation problems.      LOCATION:   BOTH EYES        QUALITY/COURSE:   Reports condition is changing.       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 2013   MEDICATIONS: Ocuflox: Strength-  SIG-  OS bid    Lumigan: 0.01% solution SIG-  1 drop OD qhs    Betagan: Strength-  SIG-  1 gtt OD qd    Neptazane: 50 mg tablet SIG-  2 tab(s) orally 2 times a day for 30 days   Alphagan P: 0.1% solution SIG-  1 gtt OU TID  REVIEW OF SYSTEMS: ROS:   GEN- Constitutional: HENT: GEN - Endocrine: Reports symptoms of LUNGS/Respiratory:  HEART/Cardiovascular: Reports symptoms of hypertension ABD/Gastrointestinal:  Musculoskeletal (BJE): NEURO/Neurological: PSYCH/Psychiatric:    Is the pt oriented to time, place, person? yes Mood normal   TOBACCO: Never smoker   ICD10: C14.481 ICD9: V13.89   SOCIAL HISTORY:] Widow.  FAMILY HISTORY:  Positive family history for  -  Glaucoma Diabetes, HTN Both sides of family with Diabetes    Brother/ Sister HTN UNCLES/AUNTS: Aunt, Cousin, 2 Uncles,  Glaucoma  Aunt Diabetes/HTN Family History - 1st Degree Relatives:  Mother dead.   Family History - 1st Degree Relatives:  ALLERGIES: Pilocarpine (Akarpine, Pilocar) Ophthalmic Solution:     NOT EFFECTIVE   Drug Allergies.  No Known.     PHYSICAL EXAMINATION: VS: BMI: 45.0.  BP: 120/74.  H: 60.00 in.  P: 72 /min.   RR: 16 /min.  W: 230lbs 0oz.   Exam: GENERAL: Appearance: General appearance can be described as well-nourished, well-developed, and in no acute distress.        Va  OD: CC 20/40+1      ph NI OS: CC 20/25-  ph  phni     OU 20/20 -2  EYEGLASSES:  OD: -2.00 +2.00 x 004  OS:  -2.00 +1.00 x 178 ADD:  +1.75   MR  03/22/2015 17:04   OD:  -1.75 +1.50 x008 20/40  OS:  -1.25 +1.25 x019 20/25 ADD  +2.50  VF:   OD: full in all four quadrants  OS: full in all four quadrants  PUPILS: 38mm normal reaction/OS reactive   Motility full versions  cover test no shift  EYELIDS & OCULAR ADNEXA: ptosis OD   SLE: Conjunctiva: OD: low thick bleb - siedel, diffuse injection +1-2 EH:UDJSH bleb sutures intact - siedel, , conjuctival w  exposed tube & silk suture    Cornea:  OU: Arcus/ decrease tear film, staining  Anterior Chamber:   OU  deep & quiet OU    Iris: FW:YOVZC  PERIPHERAL  IRIDECTOMY  EH:UDJSHFWY PAS  Lens: OV:ZCHYIFOYD Chamber  Intraocular Lens Implant    OS: posterior chamber  intraocular lens implant  w/KP    Ta   in mmH        OD:    24    handheld 24 after DP 22 OS:    16 handheld  Time  Gonio:OD internal sclerostomy open  Dilation:  Fundus:  optic nerve OD:90% cup & temp pallor                                               OS: 90% cup, temp pallor   Macula       OD: nl                                                    OS: nl  Vessels:nl  Periphery nl  HRT MRA ABN OU    OD c/d 0.86   OS c/d 0.95   Humphrey visual feel right eye reveals  dense and inferior arcuate defect mean deviation -13 to -10 eye of appears stable compared test conductive 2013     Exam: GENERAL: Appearance: HEAD, EARS, NOSE AND THROAT: Ears-Nose (external) Inspection: Externally, nose and ears are normal in appearance and without scars, lesions, or nodules.      Hearing assessment shows no problems with normal conversation.      LUNGS and RESPIRATORY: Lung auscultation  elicits no wheezing, rhonci, rales or rubs and with equal breath sounds.    Respiratory effort described as breathing is unlabored and chest movement is symmetrical.    HEART (Cardiovascular): Heart auscultation discovers regular rate and rhythm; no murmur, gallop or rub. Normal heart sounds.    ABDOMEN (Gastrointestinal): Mass/Tenderness Exam: Neither are present.     MUSCULOSKELETAL (BJE): Inspection-Palpation: No major bone, joint, tendon, or muscle changes.      NEUROLOGICAL: Alert and oriented. No major deficits of coordination or sensation.      PSYCHIATRIC: Insight and judgment appear  both to be intact and appropriate.    Mood and affect are described as normal mood and full affect.    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    ___________________________ Marylynn Pearson, Lauris Chroman - Inactive Problems:

## 2015-05-04 NOTE — Anesthesia Procedure Notes (Signed)
Procedure Name: MAC Date/Time: 05/04/2015 9:05 AM Performed by: Scheryl Darter Pre-anesthesia Checklist: Patient identified, Emergency Drugs available, Suction available, Patient being monitored and Timeout performed Patient Re-evaluated:Patient Re-evaluated prior to inductionOxygen Delivery Method: Nasal cannula Placement Confirmation: positive ETCO2 and breath sounds checked- equal and bilateral Dental Injury: Teeth and Oropharynx as per pre-operative assessment

## 2015-05-04 NOTE — Interval H&P Note (Signed)
History and Physical Interval Note:  05/04/2015 8:27 AM  [name]  has presented today for surgery, with the diagnosis of OPENED ANGLED GLAUCOMA SEVERE STAGE LEFT EYE WITH WOUND DEHISCENCE.  The various methods of treatment have been discussed with the patient and family. After consideration of risks, benefits and other options for treatment, the patient has consented to  Procedure(s): REVISION AHMED VALVE LEFT EYE (Left) as a surgical intervention .  The patient's history has been reviewed, patient examined, no change in status, stable for surgery.  I have reviewed the patient's chart and labs.  Questions were answered to the patient's satisfaction.     , 

## 2015-05-04 NOTE — Transfer of Care (Signed)
Immediate Anesthesia Transfer of Care Note  Patient: [name]  Procedure(s) Performed: Procedure(s): REVISION AHMED VALVE LEFT EYE WITH TUTOPLAST AND AMNIOGRAFT (Left)  Patient Location: PACU- phase 2  Anesthesia Type:MAC  Level of Consciousness: awake, alert  and oriented  Airway & Oxygen Therapy: Patient Spontanous Breathing  Post-op Assessment: Report given to RN, Post -op Vital signs reviewed and stable and Patient moving all extremities  Post vital signs: Reviewed and stable  Last Vitals:  Filed Vitals:   05/04/15 0644  BP: 169/100  Pulse:   Temp:   Resp:     Complications: No apparent anesthesia complications

## 2015-05-04 NOTE — Op Note (Signed)
Preoperative diagnosis: Surgical wound dehiscence with exposed Ahmed valve tube left eye Postop diagnosis: Same Procedure: Revision of operative wound  using Tutoplast graft and amniotic membrane graft left eye Anesthesia: 2% Xylocaine with epinephrine in a 50-50 mixture 0.75% Marcaine with ample Wydase Procedure: After transporting the patient to the operating room she was given a peribulbar block with the aforementioned local anesthesia agent. The patient's face was then prepped and draped in usual sterile fashion with the surgeon sitting at the 12:00 position and the operating microscope in position it was noted the patient has a very tight fissure a 6-0 nylon suture was passed through clear cornea to infraducted the eye. After achieving adequate exposure it was noted that there was 9-0 nylon suture around the tube that was loose and the tube was elevated through the conjunctiva with the end of the tube still resting in the anterior chamber the 9-0 suture was cut free it was then necessary to use Hoskins forceps and Wescott scissors to very carefully spread the conjunctiva laterally to the tube. Using blunt dissection bleeding was controlled with cautery a blunt went a blunt Vannas scissors were also used to carefully expose the sclera removing T9 tissue around the tube. After adequate exposure had been achieved on each side of the tube the tube was sutured to the sclera using a 9-0 nylon suture. The Tutoplast was examined and noted to have no defects. The Tutoplast was reference [id number] the Tutoplast was cut in half and then fashioned to cover the tube the Tutoplast was then placed under the edges of the conjunctiva it was sutured to the sclera using 9-0 nylon suture following this the amniotic membrane graft was examined and again cut to cover the Tutoplast using a 90 Vicryls suture on a BV 100 needle the conjunctiva was sutured over the Tutoplast until the conjunctiva was no longer able to cover the  Tutoplast because the tissue was too retracted into tight therefore the amniotic membrane graft was then cut to fit over the exposed Tutoplast the membrane was laid on the cornea the edges of the amniotic membrane was sutured to the cornea using a 90 Vicryls suture on a BV 100 needle and then 2 additional 90 Vicryls sutures on a BV 100 needle were used to suture the amniotic membrane graft over the Tutoplast graft the closure appeared to be done very well the prior to suturing the 2 to the eye it was necessary to inject Provisc in the anterior chamber the Provisc was then removed from the anterior chamber by doing a clear cornea paracentesis and irrigating the Provisc out following this all instruments were removed from the eye this fixation suture was removed Tobradex ointment was applied to the eye a patch and Fox U were placed and the patient returned to recovery area in stable condition. Marylynn Pearson Junior M.D.

## 2015-05-04 NOTE — Discharge Instructions (Signed)
The patient should leave the eye patch on her eye all night do not remove the patch. The patient should sleep on her back or right side. Avoid heavy lifting bending or straining.

## 2015-05-04 NOTE — Anesthesia Preprocedure Evaluation (Signed)
Anesthesia Evaluation  Patient identified by MRN, date of birth, ID band Patient awake    Reviewed: Allergy & Precautions, H&P , NPO status , Patient's Chart, lab work & pertinent test results  Airway Mallampati: III  TM Distance: >3 FB Neck ROM: Full    Dental no notable dental hx. (+) Partial Lower, Partial Upper, Dental Advisory Given   Pulmonary sleep apnea and Continuous Positive Airway Pressure Ventilation , former smoker,  breath sounds clear to auscultation  Pulmonary exam normal       Cardiovascular hypertension, Pt. on medications Rhythm:Regular Rate:Normal     Neuro/Psych negative neurological ROS  negative psych ROS   GI/Hepatic negative GI ROS, Neg liver ROS,   Endo/Other  negative endocrine ROS  Renal/GU negative Renal ROS  negative genitourinary   Musculoskeletal  (+) Arthritis -, Osteoarthritis,    Abdominal   Peds  Hematology negative hematology ROS (+)   Anesthesia Other Findings   Reproductive/Obstetrics negative OB ROS                             Anesthesia Physical Anesthesia Plan  ASA: III  Anesthesia Plan: MAC   Post-op Pain Management:    Induction: Intravenous  Airway Management Planned: Simple Face Mask  Additional Equipment:   Intra-op Plan:   Post-operative Plan:   Informed Consent: I have reviewed the patients History and Physical, chart, labs and discussed the procedure including the risks, benefits and alternatives for the proposed anesthesia with the patient or authorized representative who has indicated his/her understanding and acceptance.   Dental advisory given  Plan Discussed with: CRNA  Anesthesia Plan Comments:         Anesthesia Quick Evaluation

## 2015-05-04 NOTE — Anesthesia Postprocedure Evaluation (Signed)
  Anesthesia Post-op Note  Patient: [name]  Procedure(s) Performed: Procedure(s): REVISION AHMED VALVE LEFT EYE WITH TUTOPLAST AND AMNIOGRAFT (Left)  Patient Location: PACU  Anesthesia Type: MAC  Level of Consciousness: awake and alert   Airway and Oxygen Therapy: Patient Spontanous Breathing  Post-op Pain: none  Post-op Assessment: Post-op Vital signs reviewed, Patient's Cardiovascular Status Stable and Respiratory Function Stable  Post-op Vital Signs: Reviewed  Filed Vitals:   05/04/15 1145  BP: 141/69  Pulse: 65  Temp:   Resp: 18    Complications: No apparent anesthesia complications

## 2015-05-05 ENCOUNTER — Encounter (HOSPITAL_COMMUNITY): Payer: Self-pay | Admitting: Ophthalmology

## 2015-05-17 ENCOUNTER — Other Ambulatory Visit: Payer: Self-pay | Admitting: Family Medicine

## 2015-07-08 ENCOUNTER — Encounter: Payer: Self-pay | Admitting: Gynecology

## 2015-08-05 DIAGNOSIS — Z961 Presence of intraocular lens: Secondary | ICD-10-CM | POA: Insufficient documentation

## 2015-08-09 ENCOUNTER — Other Ambulatory Visit: Payer: Self-pay | Admitting: Family Medicine

## 2015-08-28 ENCOUNTER — Other Ambulatory Visit: Payer: Self-pay | Admitting: Gynecology

## 2015-09-03 ENCOUNTER — Other Ambulatory Visit: Payer: Self-pay | Admitting: Gynecology

## 2015-09-12 ENCOUNTER — Telehealth: Payer: Self-pay | Admitting: *Deleted

## 2015-09-12 DIAGNOSIS — Z79890 Hormone replacement therapy (postmenopausal): Secondary | ICD-10-CM

## 2015-09-12 MED ORDER — ESTRADIOL 0.5 MG PO TABS: 0.5000 mg | ORAL_TABLET | Freq: Every day | ORAL | Status: DC

## 2015-09-12 NOTE — Telephone Encounter (Signed)
Patient has annual scheduled 11/25/15 needs refills on estradiol 0.5 mg, Rx sent.

## 2015-10-11 ENCOUNTER — Telehealth: Payer: Self-pay | Admitting: *Deleted

## 2015-10-11 NOTE — Telephone Encounter (Signed)
Faxed order for CPAP supplies to [facility]

## 2015-11-25 ENCOUNTER — Encounter: Payer: Self-pay | Admitting: Gynecology

## 2015-11-25 ENCOUNTER — Ambulatory Visit (INDEPENDENT_AMBULATORY_CARE_PROVIDER_SITE_OTHER): Payer: BLUE CROSS/BLUE SHIELD | Admitting: Gynecology

## 2015-11-25 ENCOUNTER — Other Ambulatory Visit (HOSPITAL_COMMUNITY)
Admission: RE | Admit: 2015-11-25 | Discharge: 2015-11-25 | Disposition: A | Discharge status: Home / Self Care | Payer: BLUE CROSS/BLUE SHIELD | Source: Ambulatory Visit | Attending: Gynecology | Admitting: Gynecology

## 2015-11-25 VITALS — BP 134/86 | Ht 63.0 in | Wt 223.0 lb

## 2015-11-25 DIAGNOSIS — N952 Postmenopausal atrophic vaginitis: Secondary | ICD-10-CM

## 2015-11-25 DIAGNOSIS — Z01419 Encounter for gynecological examination (general) (routine) without abnormal findings: Secondary | ICD-10-CM | POA: Diagnosis present

## 2015-11-25 DIAGNOSIS — Z79890 Hormone replacement therapy (postmenopausal): Secondary | ICD-10-CM

## 2015-11-25 MED ORDER — ESTRADIOL 0.5 MG PO TABS: 0.5000 mg | ORAL_TABLET | Freq: Every day | ORAL | Status: DC

## 2015-11-25 NOTE — Progress Notes (Signed)
[name] 1951/07/30 [id number]        64 y.o.  G0P0  for annual exam.  Doing well without complaints. Several issues noted below.  Past medical history,surgical history, problem list, medications, allergies, family history and social history were all reviewed and documented as reviewed in the EPIC chart.  ROS:  Performed with pertinent positives and negatives included in the history, assessment and plan.   Additional significant findings :  none   Exam: Programmer, multimedia Vitals:   11/25/15 1048  BP: 134/86  Height: 5\' 3"  (1.6 m)  Weight: 223 lb (101.152 kg)   General appearance:  Normal affect, orientation and appearance. Skin: Grossly normal HEENT: Without gross lesions.  No cervical or supraclavicular adenopathy. Thyroid normal.  Lungs:  Clear without wheezing, rales or rhonchi Cardiac: RR, without RMG Abdominal:  Soft, nontender, without masses, guarding, rebound, organomegaly or hernia Breasts:  Examined lying and sitting without masses, retractions, discharge or axillary adenopathy. Pelvic:  Ext/BUS/vagina with atrophic changes  Adnexa  Without masses or tenderness    Anus and perineum  Normal   Rectovaginal  Normal sphincter tone without palpated masses or tenderness.    Assessment/Plan:  64 y.o. G0P0 female for annual exam.   1. Postmenopausal/atrophic genital changes/ERT.  Patient continues on estradiol 0.5 mg daily. Has tried to wean with unacceptable hot flushes and night sweats. Started after her TAH/BSO in 1991. Reviewed issues of HRT to include the WHI study with increased risk of stroke heart attack DVT and breast cancer. When to wean and the recommendations for lowest dose per shortest period of time discussed. At this point patient wants to continue and I refilled her 1 year. 2. Pap smear 2012. Pap smear of vaginal cuff today. No history of significant abnormal Pap smears. Options to stop screening altogether per current screening guidelines based on  her hysterectomy history versus less frequent screening intervals reviewed. Will readdress on an annual basis. 3. DEXA 2015 normal. Plan repeat at 5 year interval. Increased calcium and vitamin D. 4. Colonoscopy 2016. Repeat at their recommended interval. 5. Mammography 06/2015. Continue with annual mammography when due. SBE monthly reviewed. 6. Health maintenance.  Mild elevated blood pressure noted to the patient 134/86. She is being followed for hypertension. No routine blood work done as this is done at her primary physician's office. Follow up 1 year, sooner as needed.   [name] MD, 11:13 AM 11/25/2015

## 2015-11-25 NOTE — Patient Instructions (Signed)

## 2015-11-26 LAB — URINALYSIS W MICROSCOPIC + REFLEX CULTURE
Bilirubin Urine: NEGATIVE
CRYSTALS: NONE SEEN [HPF]
Casts: NONE SEEN [LPF]
Glucose, UA: NEGATIVE
Hgb urine dipstick: NEGATIVE
Ketones, ur: NEGATIVE
Leukocytes, UA: NEGATIVE
NITRITE: NEGATIVE
PH: 6 (ref 5.0–8.0)
Protein, ur: NEGATIVE
Specific Gravity, Urine: 1.017 (ref 1.001–1.035)
Yeast: NONE SEEN [HPF]

## 2015-11-28 ENCOUNTER — Other Ambulatory Visit: Payer: Self-pay | Admitting: Gynecology

## 2015-11-28 MED ORDER — NITROFURANTOIN MONOHYD MACRO 100 MG PO CAPS: 100.0000 mg | ORAL_CAPSULE | Freq: Two times a day (BID) | ORAL | Status: DC

## 2015-11-29 LAB — CYTOLOGY - PAP

## 2015-11-29 LAB — URINE CULTURE

## 2015-12-05 ENCOUNTER — Other Ambulatory Visit: Payer: Self-pay | Admitting: Gynecology

## 2016-07-11 ENCOUNTER — Encounter: Payer: Self-pay | Admitting: Gynecology

## 2016-10-08 ENCOUNTER — Ambulatory Visit (INDEPENDENT_AMBULATORY_CARE_PROVIDER_SITE_OTHER): Payer: Managed Care, Other (non HMO) | Admitting: Family Medicine

## 2016-10-08 VITALS — BP 128/82 | HR 96 | Temp 99.5°F | Resp 17 | Ht 62.5 in | Wt 225.0 lb

## 2016-10-08 DIAGNOSIS — R6883 Chills (without fever): Secondary | ICD-10-CM | POA: Diagnosis not present

## 2016-10-08 DIAGNOSIS — R509 Fever, unspecified: Secondary | ICD-10-CM

## 2016-10-08 DIAGNOSIS — J988 Other specified respiratory disorders: Secondary | ICD-10-CM | POA: Diagnosis not present

## 2016-10-08 DIAGNOSIS — J029 Acute pharyngitis, unspecified: Secondary | ICD-10-CM | POA: Diagnosis not present

## 2016-10-08 LAB — POCT RAPID STREP A (OFFICE): Rapid Strep A Screen: NEGATIVE

## 2016-10-08 LAB — POCT INFLUENZA A/B
INFLUENZA A, POC: NEGATIVE
Influenza B, POC: NEGATIVE

## 2016-10-08 MED ORDER — AMOXICILLIN-POT CLAVULANATE 875-125 MG PO TABS: 1.0000 | ORAL_TABLET | Freq: Two times a day (BID) | ORAL | 0 refills | Status: DC

## 2016-10-08 MED ORDER — BENZONATATE 100 MG PO CAPS: 100.0000 mg | ORAL_CAPSULE | Freq: Three times a day (TID) | ORAL | 0 refills | Status: DC | PRN

## 2016-10-08 NOTE — Progress Notes (Signed)
Patient ID: [name], female    DOB: 03/13/1951, 64 y.o.   MRN: [id number]  PCP: Joycelyn Man, MD  Chief Complaint  Patient presents with  . Cough  . Sore Throat  . Chills    Subjective:   HPI 65 year old female presents for evaluation of cough, sore throat, and chills times times 3 days. Sore throat and headache. Headache resolved yesterday. Fatigued and very decreased appetite. Sleeping mostly over the weekend. Some blood mixed with greenish sputum when she is able to cough up phlegm. No chest tightness or no shortness of breath. Exposed to a sick friend about one week prior. Has not had a influenza vaccine. She has taken Tylenol sinus which alleviated the headache.  Social History   Social History  . Marital status: Widowed    Spouse name: N/A  . Number of children: N/A  . Years of education: N/A   Occupational History  . Not on file.   Social History Main Topics  . Smoking status: Former Smoker    Quit date: 11/26/1980  . Smokeless tobacco: Never Used  . Alcohol use No  . Drug use: No  . Sexual activity: No   Other Topics Concern  . Not on file   Social History Narrative  . No narrative on file   Family History  Problem Relation Age of Onset  . Cancer Mother     Bladder cancer  . Hypertension Brother   . COPD Brother   . Hypertension Sister   . Breast cancer Sister     Age 56  . Cancer Sister     Bladder cancer  . Stroke Maternal Grandmother   . Colon cancer Neg Hx    Review of Systems See HPI  Patient Active Problem List   Diagnosis Date Noted  . Visit for TB skin test 09/14/2014  . OSA on CPAP 06/22/2013  . Overweight 05/05/2013  . Status post hysterectomy with oophorectomy 05/05/2013  . Hot flashes due to surgical menopause 05/05/2013  . Degenerative joint disease 05/05/2013  . Rapid heart rate 05/05/2013  . History of kidney stones 05/05/2013  . Glaucoma      Prior to Admission medications   Medication Sig Start Date  End Date Taking? Authorizing Provider  acetaminophen (TYLENOL) 500 MG tablet Take 500 mg by mouth every 6 (six) hours as needed for mild pain or moderate pain.   Yes Historical Provider, MD  cholecalciferol (VITAMIN D) 1000 UNITS tablet Take 1,000 Units by mouth daily.   Yes Historical Provider, MD  estradiol (ESTRACE) 0.5 MG tablet Take 1 tablet (0.5 mg total) by mouth daily. 11/25/15  Yes [name], MD  estradiol (ESTRACE) 0.5 MG tablet TAKE 1 TABLET (0.5 MG TOTAL) BY MOUTH DAILY. 12/05/15  Yes Terrance Mass, MD  fish oil-omega-3 fatty acids 1000 MG capsule Take 2 g by mouth daily.   Yes Historical Provider, MD  hydrochlorothiazide (HYDRODIURIL) 25 MG tablet TAKE 1 TABLET BY MOUTH EVERY DAY 08/10/15  Yes Dorena Cookey, MD  NON FORMULARY Reported on 11/25/2015   Yes Historical Provider, MD  vitamin E 1000 UNIT capsule Take 1,000 Units by mouth daily.   Yes Historical Provider, MD     Allergies  Allergen Reactions  . Aspirin     Can not take Aspirin and Sulfa in combination  . Sulfa Antibiotics       Objective:  Physical Exam  Constitutional: She is oriented to person, place, and time. She  appears well-developed and well-nourished.  HENT:  Head: Normocephalic and atraumatic.  Right Ear: External ear normal.  Left Ear: External ear normal.  Nose: Mucosal edema and rhinorrhea present.  Mouth/Throat: No posterior oropharyngeal erythema.  Eyes: Conjunctivae and EOM are normal. Pupils are equal, round, and reactive to light.  Neck: Normal range of motion. Neck supple.  Cardiovascular: Normal rate, regular rhythm, normal heart sounds and intact distal pulses.   Pulmonary/Chest: Effort normal and breath sounds normal.  Musculoskeletal: Normal range of motion.  Neurological: She is alert and oriented to person, place, and time.  Skin: Skin is warm and dry.  Psychiatric: She has a normal mood and affect. Her behavior is normal. Judgment and thought content normal.    Vitals:    10/08/16 1142  BP: 128/82  Pulse: 96  Resp: 17  Temp: 99.5 F (37.5 C)    Assessment & Plan:  1. Congestion of respiratory tract 2. Chills - POCT Influenza A/B - POCT rapid strep A 3. Sore throat - POCT rapid strep A - Culture, Group A Strep 4. Feels feverish - POCT Influenza A/B  Plan: . amoxicillin-clavulanate (AUGMENTIN) 875-125 MG tablet    Sig: Take 1 tablet by mouth 2 (two) times daily.  . benzonatate (TESSALON) 100 MG capsule    Sig: Take 1-2 capsules (100-200 mg total) by mouth 3 (three) times daily as needed for cough.    Return for care if symptoms worsen or do not improve.  Carroll Sage. Kenton Kingfisher, MSN, FNP-C [facility]

## 2016-10-08 NOTE — Patient Instructions (Addendum)
Cough-Take Benzonatate 100-200 mg up to 3 times daily for cough as needed.  Start Augmentin 875-125 1 tablet, twice daily for 10 days Sinusitis.   IF you received an x-ray today, you will receive an invoice from [facility]. Please contact [facility] at [phone] with questions or concerns regarding your invoice.   IF you received labwork today, you will receive an invoice from [employer]. Please contact [facility] at [phone] with questions or concerns regarding your invoice.   Our billing staff will not be able to assist you with questions regarding bills from these companies.  You will be contacted with the lab results as soon as they are available. The fastest way to get your results is to activate your My Chart account. Instructions are located on the last page of this paperwork. If you have not heard from [place] regarding the results in 2 weeks, please contact this office.    Sinusitis, Adult Sinusitis is redness, soreness, and inflammation of the paranasal sinuses. Paranasal sinuses are air pockets within the bones of your face. They are located beneath your eyes, in the middle of your forehead, and above your eyes. In healthy paranasal sinuses, mucus is able to drain out, and air is able to circulate through them by way of your nose. However, when your paranasal sinuses are inflamed, mucus and air can become trapped. This can allow bacteria and other germs to grow and cause infection. Sinusitis can develop quickly and last only a short time (acute) or continue over a long period (chronic). Sinusitis that lasts for more than 12 weeks is considered chronic. CAUSES Causes of sinusitis include:  Allergies.  Structural abnormalities, such as displacement of the cartilage that separates your nostrils (deviated septum), which can decrease the air flow through your nose and sinuses and affect sinus drainage.  Functional abnormalities, such as  when the small hairs (cilia) that line your sinuses and help remove mucus do not work properly or are not present. SIGNS AND SYMPTOMS Symptoms of acute and chronic sinusitis are the same. The primary symptoms are pain and pressure around the affected sinuses. Other symptoms include:  Upper toothache.  Earache.  Headache.  Bad breath.  Decreased sense of smell and taste.  A cough, which worsens when you are lying flat.  Fatigue.  Fever.  Thick drainage from your nose, which often is green and may contain pus (purulent).  Swelling and warmth over the affected sinuses. DIAGNOSIS Your health care provider will perform a physical exam. During your exam, your health care provider may perform any of the following to help determine if you have acute sinusitis or chronic sinusitis:  Look in your nose for signs of abnormal growths in your nostrils (nasal polyps).  Tap over the affected sinus to check for signs of infection.  View the inside of your sinuses using an imaging device that has a light attached (endoscope). If your health care provider suspects that you have chronic sinusitis, one or more of the following tests may be recommended:  Allergy tests.  Nasal culture. A sample of mucus is taken from your nose, sent to a lab, and screened for bacteria.  Nasal cytology. A sample of mucus is taken from your nose and examined by your health care provider to determine if your sinusitis is related to an allergy. TREATMENT Most cases of acute sinusitis are related to a viral infection and will resolve on their own within 10 days. Sometimes, medicines are prescribed to help relieve  symptoms of both acute and chronic sinusitis. These may include pain medicines, decongestants, nasal steroid sprays, or saline sprays. However, for sinusitis related to a bacterial infection, your health care provider will prescribe antibiotic medicines. These are medicines that will help kill the bacteria  causing the infection. Rarely, sinusitis is caused by a fungal infection. In these cases, your health care provider will prescribe antifungal medicine. For some cases of chronic sinusitis, surgery is needed. Generally, these are cases in which sinusitis recurs more than 3 times per year, despite other treatments. HOME CARE INSTRUCTIONS  Drink plenty of water. Water helps thin the mucus so your sinuses can drain more easily.  Use a humidifier.  Inhale steam 3-4 times a day (for example, sit in the bathroom with the shower running).  Apply a warm, moist washcloth to your face 3-4 times a day, or as directed by your health care provider.  Use saline nasal sprays to help moisten and clean your sinuses.  Take medicines only as directed by your health care provider.  If you were prescribed either an antibiotic or antifungal medicine, finish it all even if you start to feel better. SEEK IMMEDIATE MEDICAL CARE IF:  You have increasing pain or severe headaches.  You have nausea, vomiting, or drowsiness.  You have swelling around your face.  You have vision problems.  You have a stiff neck.  You have difficulty breathing.   This information is not intended to replace advice given to you by your health care provider. Make sure you discuss any questions you have with your health care provider.   Document Released: 11/12/2005 Document Revised: 12/03/2014 Document Reviewed: 11/27/2011 Elsevier Interactive Patient Education [employer].

## 2016-10-10 LAB — CULTURE, GROUP A STREP: Organism ID, Bacteria: NORMAL

## 2016-11-25 ENCOUNTER — Other Ambulatory Visit: Payer: Self-pay | Admitting: Gynecology

## 2016-11-25 DIAGNOSIS — Z79890 Hormone replacement therapy (postmenopausal): Secondary | ICD-10-CM

## 2016-11-27 ENCOUNTER — Telehealth: Payer: Self-pay | Admitting: *Deleted

## 2016-11-27 MED ORDER — ESTRADIOL 0.5 MG PO TABS: ORAL_TABLET | ORAL | 0 refills | Status: DC

## 2016-11-27 NOTE — Telephone Encounter (Signed)
Annual scheduled on 12/04/16, needs refill on HRT. Estradiol sent.

## 2016-11-29 ENCOUNTER — Encounter: Payer: BLUE CROSS/BLUE SHIELD | Admitting: Gynecology

## 2016-12-04 ENCOUNTER — Ambulatory Visit (INDEPENDENT_AMBULATORY_CARE_PROVIDER_SITE_OTHER): Payer: Managed Care, Other (non HMO) | Admitting: Gynecology

## 2016-12-04 ENCOUNTER — Encounter: Payer: Self-pay | Admitting: Gynecology

## 2016-12-04 VITALS — BP 124/82 | Ht 64.0 in | Wt 227.0 lb

## 2016-12-04 DIAGNOSIS — Z79890 Hormone replacement therapy (postmenopausal): Secondary | ICD-10-CM | POA: Diagnosis not present

## 2016-12-04 DIAGNOSIS — N952 Postmenopausal atrophic vaginitis: Secondary | ICD-10-CM | POA: Diagnosis not present

## 2016-12-04 DIAGNOSIS — Z01411 Encounter for gynecological examination (general) (routine) with abnormal findings: Secondary | ICD-10-CM

## 2016-12-04 MED ORDER — ESTRADIOL 0.5 MG PO TABS: ORAL_TABLET | ORAL | 11 refills | Status: DC

## 2016-12-04 NOTE — Patient Instructions (Signed)

## 2016-12-04 NOTE — Progress Notes (Signed)
    EUSTOLIA QUINTERO 17-Oct-1951 [id number]        66 y.o.  G0P0 for annual exam.    Past medical history,surgical history, problem list, medications, allergies, family history and social history were all reviewed and documented as reviewed in the EPIC chart.  ROS:  Performed with pertinent positives and negatives included in the history, assessment and plan.   Additional significant findings :  None   Exam: [name] assistant Vitals:   12/04/16 0807  BP: 124/82  Weight: 227 lb (103 kg)  Height: 5\' 4"  (1.626 m)   Body mass index is 38.96 kg/m.  General appearance:  Normal affect, orientation and appearance. Skin: Grossly normal HEENT: Without gross lesions.  No cervical or supraclavicular adenopathy. Thyroid normal.  Lungs:  Clear without wheezing, rales or rhonchi Cardiac: RR, without RMG Abdominal:  Soft, nontender, without masses, guarding, rebound, organomegaly or hernia Breasts:  Examined lying and sitting without masses, retractions, discharge or axillary adenopathy. Pelvic:  Ext, BUS, Vagina with atrophic changes  Adnexa without masses or tenderness    Anus and perineum normal   Rectovaginal normal sphincter tone without palpated masses or tenderness.    Assessment/Plan:  66 y.o. G0P0 female for annual exam.   1. Postmenopausal/atrophic genital changes. Status post TAH/BSO 1991. On Estrace 0.5 mg daily. I reviewed the HRT issue and the most current 2017 NAMS guidelines. Risks to include stroke heart attack DVT breast cancer reviewed. Benefits to include symptom relief and possible cardiovascular/bone health when started early also discussed. At this point patient wants to continue understanding the risks and I refilled her 1 year. 2. Pap smear 2016. No Pap smear done today. No history of significant abnormal Pap smears. Reviewed current screening guidelines and options to stop screening altogether given hysterectomy for benign indications discussed. Will readdress  on annual basis. 3. DEXA 2015 normal. Will plan repeat at 5 year interval. Increased calcium vitamin D reviewed. 4. Mammography 06/2016. Continue with annual mammography when due. SBE monthly reviewed. 5. Colonoscopy 2016. Repeat at their recommended interval. 6. Health maintenance. No routine lab work done as patient reports this done elsewhere. Follow up 1 year, sooner as needed.   [name] MD, 8:33 AM 12/04/2016

## 2017-04-10 ENCOUNTER — Encounter: Payer: Self-pay | Admitting: Gynecology

## 2017-08-16 ENCOUNTER — Encounter: Payer: Self-pay | Admitting: Family Medicine

## 2017-09-16 ENCOUNTER — Other Ambulatory Visit: Payer: Self-pay | Admitting: *Deleted

## 2017-09-16 MED ORDER — ESTRADIOL 0.5 MG PO TABS: ORAL_TABLET | ORAL | 2 refills | Status: DC

## 2017-12-05 ENCOUNTER — Encounter: Payer: Managed Care, Other (non HMO) | Admitting: Gynecology

## 2018-01-20 ENCOUNTER — Telehealth: Payer: Self-pay | Admitting: *Deleted

## 2018-01-20 NOTE — Telephone Encounter (Signed)
Prior authorization done via cover my meds for estradiol 0.5 mg tablet, will wait for response,.

## 2018-01-20 NOTE — Telephone Encounter (Signed)
Medication approved until 11/25/18 referral # [id number]

## 2018-04-12 ENCOUNTER — Encounter: Payer: Self-pay | Admitting: Gynecology

## 2018-05-12 ENCOUNTER — Ambulatory Visit (INDEPENDENT_AMBULATORY_CARE_PROVIDER_SITE_OTHER): Payer: Medicare HMO | Admitting: Gynecology

## 2018-05-12 ENCOUNTER — Encounter: Payer: Self-pay | Admitting: Gynecology

## 2018-05-12 VITALS — BP 124/74 | Ht 63.0 in | Wt 222.0 lb

## 2018-05-12 DIAGNOSIS — Z01419 Encounter for gynecological examination (general) (routine) without abnormal findings: Secondary | ICD-10-CM

## 2018-05-12 DIAGNOSIS — Z79890 Hormone replacement therapy (postmenopausal): Secondary | ICD-10-CM

## 2018-05-12 MED ORDER — ESTRADIOL 0.5 MG PO TABS: ORAL_TABLET | ORAL | 4 refills | Status: DC

## 2018-05-12 NOTE — Patient Instructions (Signed)
Follow up in one year for annual exam 

## 2018-05-12 NOTE — Progress Notes (Addendum)
    [name] Jun 28, 1951 [id number]        67 y.o.  G0P0 for breast and pelvic exam.  Doing well without complaints.  Past medical history,surgical history, problem list, medications, allergies, family history and social history were all reviewed and documented as reviewed in the EPIC chart.  ROS:  Performed with pertinent positives and negatives included in the history, assessment and plan.   Additional significant findings : None   Exam: [name] assistant Vitals:   05/12/18 1127  BP: 124/74  Weight: 222 lb (100.7 kg)  Height: 5\' 3"  (1.6 m)   Body mass index is 39.33 kg/m.  General appearance:  Normal affect, orientation and appearance. Skin: Grossly normal HEENT: Without gross lesions.  No cervical or supraclavicular adenopathy. Thyroid normal.  Lungs:  Clear without wheezing, rales or rhonchi Cardiac: RR, without RMG Abdominal:  Soft, nontender, without masses, guarding, rebound, organomegaly or hernia Breasts:  Examined lying and sitting without masses, retractions, discharge or axillary adenopathy.  Bilateral well-healed reduction scars. Pelvic:  Ext, BUS, Vagina: Normal with atrophic changes  Adnexa: Without masses or tenderness    Anus and perineum: Normal   Rectovaginal: Normal sphincter tone without palpated masses or tenderness.    Assessment/Plan:  67 y.o. G0P0 female for breast and pelvic status post TAH/BSO 1991.   1. Postmenopausal/atrophic genital changes/HRT.  Continues on Estrace 0.5 mg daily.  Has tried stopping with unacceptable hot flushes.  We again reviewed the risks to include thrombosis such as stroke heart attack DVT in the breast cancer issue.  Benefits as far as symptom relief and possible cardiovascular and bone health reviewed.  We talked about how to wean and to do this slowly.  I recommended half tablet daily for 1 to 2 months then half tablet every other day for 1 to 2 months then stop.  Patient wants to go ahead and try this.  I did  refill her x1 year in the event that she finds it unacceptable to stop. 2. Mammography 03/2018.  Continue with annual mammography next year.  Breast exam normal today. 3. Colonoscopy 2016.  Repeat at their recommended interval. 4. DEXA 2015 normal.  Recommended repeat DEXA next year at 5-year interval and we will schedule at next year. 5. Pap smear 2016.  No Pap smear done today.  No history of significant abnormal Pap smears.  We reviewed current screening guidelines and we both agree to stop screening based on hysterectomy and age. 6. Health maintenance.  No routine lab work done as patient does this elsewhere.  Follow-up 1 year, sooner as needed.   [name] MD, 11:52 AM 05/12/2018

## 2018-09-10 DIAGNOSIS — Z006 Encounter for examination for normal comparison and control in clinical research program: Secondary | ICD-10-CM | POA: Insufficient documentation

## 2019-01-19 ENCOUNTER — Encounter: Payer: Self-pay | Admitting: *Deleted

## 2019-01-19 ENCOUNTER — Encounter: Payer: Self-pay | Admitting: Neurology

## 2019-01-19 ENCOUNTER — Ambulatory Visit: Payer: Medicare PPO | Admitting: Neurology

## 2019-01-19 VITALS — BP 148/89 | HR 72 | Ht 63.0 in | Wt 224.0 lb

## 2019-01-19 DIAGNOSIS — Z9989 Dependence on other enabling machines and devices: Secondary | ICD-10-CM

## 2019-01-19 DIAGNOSIS — Z6841 Body Mass Index (BMI) 40.0 and over, adult: Secondary | ICD-10-CM

## 2019-01-19 DIAGNOSIS — I10 Essential (primary) hypertension: Secondary | ICD-10-CM | POA: Diagnosis not present

## 2019-01-19 DIAGNOSIS — G4733 Obstructive sleep apnea (adult) (pediatric): Secondary | ICD-10-CM

## 2019-01-19 DIAGNOSIS — M1711 Unilateral primary osteoarthritis, right knee: Secondary | ICD-10-CM | POA: Diagnosis not present

## 2019-01-19 NOTE — Progress Notes (Signed)
SLEEP MEDICINE CLINIC   Provider:  Larey Seat, MD    Primary Care Physician:  Janie Morning, DO   Referring Provider: Dorena Cookey, MD    Chief Complaint  Patient presents with  . New Patient (Initial Visit)    pt alone, rm 10. pt states that she had a sleep study and was diagnosed  with sleep apnea. started CPAP. she has been purchasing the supplies online.     HPI:  [name] is a 68 y.o. female patient seen on 01-19-2019 upon a referral  from Dr. Sherren Mocha , MD / Dr. Theda Sers, DO for a sleep evaluation.  She is a known OSA patient and currently on her second CPAP, which she uses daily. [name] also has a history of hypertension, she is at this time considered morbidly obese as her body mass index is approached 67, she has high cholesterol, glaucoma, she has an abdominal umbilical hernia, breast reduction surgery surgery, hysterectomy and several eye surgeries.  Also listed are chronic kidney disease but no grade was named, hypertension and allergic rhinitis.  I reviewed the current medication,  Chief complaint according to patient : " I know I have to sleep with a CPAP but I never had a new machine in over 10 years."  Sleep habits are as follows:  Dinner time is 5 PM and sleep time is 10.00 PM.  Bedroom is cool, quiet and dark. She sleeps supine, on one pillow, in a flat bed. She dreams, but is not known to act out.  She does not snore while on CPAP, has no dry mouth, but nocturia times 2-3 times. Wakes up at 6.30 and rises- refreshed and restored. She is using nasal pillows, a bella swift with ear loops.   Sleep medical history: see above   Family sleep history: unknown.   Social history: lives alone, childless. The patient is retired but she still has a part-time job, she raised her nephew, her husband died in 8.  She is a former smoker but quit in 1982, she does not drink alcohol, she drinks coffee but mixes it 50-50 with decaf.  Review of Systems: Out  of a complete 14 system review, the patient complains of only the following symptoms, and all other reviewed systems are negative. " I want to have a mouth piece or different mask. "  She used to go to Dr. Debara Pickett, cardiology for palpitations but found out that these were related to the ingestion of green tea.  She endorsed an Epworth Sleepiness Scale at 4 points, the fatigue severity score at 15 points,   she is a compliant CPAP user her geriatric depression score was endorsed at only 1 out of 15 points she has been using the CPAP machine 97% of the time this is an S9 Elite model set at 9 cmH2O with 3 cm EPR and she achieves a residual AHI of 2.9 there are some air leaks there for more than half of all nights but no central apneas are recorded.   She is definitely due for a new year and probably a new machine..       Social History   Socioeconomic History  . Marital status: Widowed    Spouse name: Not on file  . Number of children: Not on file  . Years of education: Not on file  . Highest education level: Not on file  Occupational History  . Not on file  Social Needs  . Financial resource strain: Not  on file  . Food insecurity:    Worry: Not on file    Inability: Not on file  . Transportation needs:    Medical: Not on file    Non-medical: Not on file  Tobacco Use  . Smoking status: Former Smoker    Last attempt to quit: 11/26/1980    Years since quitting: 38.1  . Smokeless tobacco: Never Used  Substance and Sexual Activity  . Alcohol use: No    Alcohol/week: 0.0 standard drinks  . Drug use: No  . Sexual activity: Not Currently    Birth control/protection: Surgical    Comment: 1st intercourse 68 yo-Fewer than 5 partners  Lifestyle  . Physical activity:    Days per week: Not on file    Minutes per session: Not on file  . Stress: Not on file  Relationships  . Social connections:    Talks on phone: Not on file    Gets together: Not on file    Attends religious service: Not  on file    Active member of club or organization: Not on file    Attends meetings of clubs or organizations: Not on file    Relationship status: Not on file  . Intimate partner violence:    Fear of current or ex partner: Not on file    Emotionally abused: Not on file    Physically abused: Not on file    Forced sexual activity: Not on file  Other Topics Concern  . Not on file  Social History Narrative  . Not on file    Family History  Problem Relation Age of Onset  . Cancer Mother        Bladder cancer  . Hypertension Brother   . COPD Brother   . Hypertension Sister   . Breast cancer Sister        Age 79  . Cancer Sister        Bladder cancer  . Stroke Maternal Grandmother   . Colon cancer Neg Hx     Past Medical History:  Diagnosis Date  . Arthritis   . Blood transfusion without reported diagnosis   . Cataract   . Diverticulitis   . Fibroid   . GERD (gastroesophageal reflux disease)    occ  . Glaucoma   . Heart murmur   . History of echocardiogram 10/16/2011   EF >55%; borderline concentric LVH; mild MR, mild TR  . History of palpitations   . Hyperlipidemia   . Hypertension   . OSA on CPAP     Past Surgical History:  Procedure Laterality Date  . ABDOMINAL HYSTERECTOMY  1991   TAH,BSO  . BREAST SURGERY     REDUCTION MAMMAPLASTY  . EYE SURGERY  2010  . EYE SURGERY     shunt due to glaucoma  . HERNIA REPAIR    . INSERTION OF AHMED VALVE Left 05/04/2015   Procedure: REVISION AHMED VALVE LEFT EYE WITH TUTOPLAST AND AMNIOGRAFT;  Surgeon: Marylynn Pearson, MD;  Location: [facility];  Service: Ophthalmology;  Laterality: Left;  . JOINT REPLACEMENT    . OOPHORECTOMY     BSO  . REPLACEMENT TOTAL KNEE Right 2008  . TONSILLECTOMY      Current Outpatient Medications  Medication Sig Dispense Refill  . acetaminophen (TYLENOL) 500 MG tablet Take 500 mg by mouth every 6 (six) hours as needed for mild pain or moderate pain.    . bimatoprost (LUMIGAN) 0.03 % ophthalmic  solution 1 drop at bedtime.    Marland Kitchen  Brimonidine Tartrate-Timolol (COMBIGAN OP) Apply to eye.    . cholecalciferol (VITAMIN D) 1000 UNITS tablet Take 1,000 Units by mouth daily.    Marland Kitchen estradiol (ESTRACE) 0.5 MG tablet TAKE 1 TABLET (0.5 MG TOTAL) BY MOUTH DAILY. 90 tablet 4  . fish oil-omega-3 fatty acids 1000 MG capsule Take 2 g by mouth daily.    . hydrochlorothiazide (HYDRODIURIL) 25 MG tablet TAKE 1 TABLET BY MOUTH EVERY DAY 90 tablet 0  . vitamin E 1000 UNIT capsule Take 1,000 Units by mouth daily.    Marland Kitchen GARLIC PO Take by mouth.    . Magnesium (V-R MAGNESIUM) 250 MG TABS Take by mouth.     No current facility-administered medications for this visit.     Allergies as of 01/19/2019 - Review Complete 01/19/2019  Allergen Reaction Noted  . Aspirin  09/06/2011  . Sulfa antibiotics Hives 07/24/2011    Vitals: BP (!) 148/89   Pulse 72   Ht 5\' 3"  (1.6 m)   Wt 224 lb (101.6 kg)   BMI 39.68 kg/m  Last Weight:  Wt Readings from Last 1 Encounters:  01/19/19 224 lb (101.6 kg)   JSE:GBTD mass index is 39.68 kg/m.     Last Height:   Ht Readings from Last 1 Encounters:  01/19/19 5\' 3"  (1.6 m)    Physical exam:  General: The patient is awake, alert and appears not in acute distress. The patient is well groomed. Head: Normocephalic, atraumatic. Neck is supple. Mallampati:4  neck circumference:17" . Nasal airflow partially congestion- , TMJ click is evident . Retrognathia is seen.  Cardiovascular:  Regular rate and rhythm, without  murmurs or carotid bruit, and without distended neck veins. Respiratory: Lungs are clear to auscultation. Skin:  Without evidence of edema, or rash Trunk: BMI is 39.68 . The patient's posture is erect.   Neurologic exam : The patient is awake and alert, oriented to place and time.   Attention span & concentration ability appears normal.  Speech is fluent,  without  dysarthria, dysphonia or aphasia.  Mood and affect are appropriate.  Cranial nerves: Pupils  are equal and briskly reactive to light.  Extraocular movements  in vertical and horizontal planes intact and without nystagmus. Visual fields by finger perimetry are intact. Hearing to finger rub intact.   Facial sensation intact to fine touch.  Facial motor strength is symmetric and tongue and uvula move midline. Shoulder shrug was symmetrical.   Motor exam:  Normal tone, muscle bulk and symmetric strength in all extremities.  Sensory:  Fine touch, pinprick and vibration were tested as normal in all extremities.  Coordination: Rapid alternating movements in the fingers/hands was normal. Finger-to-nose maneuver  normal without evidence of ataxia, dysmetria or tremor. Gait and station: Patient walks without assistive device  Stance is stable and normal. Turns with  3 Steps.  Deep tendon reflexes: in the  upper and lower extremities are symmetric and intact. Babinski maneuver response is downgoing.    Assessment:  After physical and neurologic examination, review of laboratory studies,  Personal review of imaging studies, reports of other /same  Imaging studies, results of polysomnography and / or neurophysiology testing and pre-existing records as far as provided in visit., my assessment is:   1) [name] risk factors for the continued presence of obstructive sleep apnea include a body mass index that is approaching 40, history of hypertension, she does know that she snores,  has retrognathia which makes her more prone to have obstructive sleep apnea-  and she has a high-grade Mallampati her neck circumference of 17 inches.    My goal is to retest [name], who is a kind of [employer].  I believe that her insurance carrier will not permit an in lab evaluation but a home sleep test.  Given that she has used CPAP for well over a decade I would be okay with obtaining a home sleep test and then starting an auto titration device.  [name] requested to be refitted for a different  kind of mask and interface and I would like her future medical equipment company to allow her to try different models.  I will be happy to order what ever fits her best.   The patient was advised of the nature of the diagnosed disorder , the treatment options and the  risks for general health and wellness arising from not treating the condition.   I spent more than 35 minutes of face to face time with the patient.  Greater than 50% of time was spent in counseling and coordination of care. We have discussed the diagnosis and differential and I answered the patient's questions.    HST     Larey Seat, MD 04/05/210, 1:73 PM  Certified in Neurology by ABPN Certified in [place] by [facility] [address] [place], [place] [address]

## 2019-02-02 ENCOUNTER — Ambulatory Visit (INDEPENDENT_AMBULATORY_CARE_PROVIDER_SITE_OTHER): Payer: Medicare PPO | Admitting: Neurology

## 2019-02-02 DIAGNOSIS — M1711 Unilateral primary osteoarthritis, right knee: Secondary | ICD-10-CM

## 2019-02-02 DIAGNOSIS — Z9989 Dependence on other enabling machines and devices: Secondary | ICD-10-CM

## 2019-02-02 DIAGNOSIS — G4733 Obstructive sleep apnea (adult) (pediatric): Secondary | ICD-10-CM | POA: Diagnosis not present

## 2019-02-02 DIAGNOSIS — I10 Essential (primary) hypertension: Secondary | ICD-10-CM

## 2019-02-02 DIAGNOSIS — Z6841 Body Mass Index (BMI) 40.0 and over, adult: Secondary | ICD-10-CM

## 2019-02-10 DIAGNOSIS — Z6841 Body Mass Index (BMI) 40.0 and over, adult: Secondary | ICD-10-CM

## 2019-02-10 DIAGNOSIS — I10 Essential (primary) hypertension: Secondary | ICD-10-CM | POA: Insufficient documentation

## 2019-02-10 NOTE — Procedures (Signed)
[facility] [address] [place], [place] [address] NAME: [name]                                                                      DOB: [birth year] MEDICAL RECORD No: [id number]                                                  DOS: 02/02/2019 REFERRING PHYSICIAN: Stevie Kern, MD STUDY PERFORMED: [facility] on [employer] HISTORY: [name] is a 68 y.o. female patient seen on 01-19-2019 upon a referral from Dr. Janie Morning, DO for a sleep evaluation.  She is a known OSA patient and currently on her second CPAP, which she uses daily. 2-3 nocturia nightly, using a bella swift with nasal pillows. Mrs. Viviano also has a history of hypertension, she is at this time considered morbidly obese as her body mass index is approached 27, she has high cholesterol, glaucoma, she has an abdominal umbilical hernia, breast reduction surgery surgery, hysterectomy and several eye surgeries.  Also listed are chronic kidney disease (but no grade was stated), hypertension and allergic rhinitis. She used to go to Dr. Debara Pickett, cardiology for palpitations but found out that these were related to the ingestion of green tea.  She endorsed an Epworth Sleepiness Scale at 4 points, the fatigue severity score at 15 points, BMI is 40 kg/m2.   Compliance for CPAP machine 97% of the time /S9 Elite model set at 9 cmH2O with 3 cm EPR and she achieves a residual AHI of 2.9/h.  She is definitely due for a new year and probably a new machine.    STUDY RESULTS:  Total Recording Time: 9 h,1 min; Total Sleep Time: 7 h 29 min. Total Apnea/Hypopnea Index (AHI): 32.8 /h; RDI: 33.2 /h; REM AHI:  49.0/h. Average Oxygen Saturation:  95%; Lowest Oxygen Desaturation: 87 %.  Total Time Oxygen Saturation below 89 %: 0.2 minutes.  Average Heart Rate: 61 bpm (between 54 and 78 bpm). IMPRESSION:  Severe OSA at AHI of 32.8 with some REM accentuation to 49/h. No clinically significant hypoxemia.  Regular  heart rate.  RECOMMENDATION: The patient requested a new mask to be fitted, she will continue CPAP therapy with an autotitration machine, heated humidity and a setting from 6-16 cm water, 1 cm EPR.  I certify that I have reviewed the raw data recording prior to the issuance of this report in accordance with the standards of the American Academy of Sleep Medicine (AASM). Larey Seat, M.D.  02-10-2019     Medical Director of [facility], accredited by the AASM. Diplomat of the ABPN and ABSM.

## 2019-02-10 NOTE — Addendum Note (Signed)
Addended by: Larey Seat on: 02/10/2019 12:37 PM   Modules accepted: Orders

## 2019-02-11 ENCOUNTER — Telehealth: Payer: Self-pay | Admitting: Neurology

## 2019-02-11 NOTE — Telephone Encounter (Signed)
I called pt. I advised pt that Dr. Brett Fairy reviewed their sleep study results and found that pt has sleep apnea. Dr. Brett Fairy recommends that pt starts with a auto CPAP 6-16 cm water pressure. I reviewed PAP compliance expectations with the pt. Pt is agreeable to starting a CPAP. I advised pt that an order will be sent to a DME, [facility], and [facility] will call the pt within about one week after they file with the pt's insurance. [facility] will show the pt how to use the machine, fit for masks, and troubleshoot the CPAP if needed. A follow up appt was made for insurance purposes with Debbora Presto on May 26,2020 at 3:30 pm. Pt verbalized understanding to arrive 15 minutes early and bring their CPAP. A letter with all of this information in it will be mailed to the pt as a reminder. I verified with the pt that the address we have on file is correct. Pt verbalized understanding of results. Pt had no questions at this time but was encouraged to call back if questions arise. I have sent the order to [facility] and have received confirmation that they have received the order.

## 2019-02-11 NOTE — Telephone Encounter (Signed)
-----   Message from Larey Seat, MD sent at 02/10/2019 12:37 PM EDT ----- Cc Janie Morning, DO  IMPRESSION: Severe OSA at AHI of 32.8 with some REM accentuation  to 49/h. No clinically significant hypoxemia. Regular heart  rate.  RECOMMENDATION: The patient requested a new mask to be fitted,  she will continue CPAP therapy with an autotitration machine,  heated humidity and a setting from 6-16 cm water, 1 cm EPR.

## 2019-04-14 ENCOUNTER — Telehealth: Payer: Self-pay

## 2019-04-14 NOTE — Telephone Encounter (Signed)
Spoke with the patient and they have given verbal consent to file their insurance and to do a doxy.me visit. E-mail has been confirmed and sent.  E-mail: [email or web address]

## 2019-04-21 ENCOUNTER — Ambulatory Visit (INDEPENDENT_AMBULATORY_CARE_PROVIDER_SITE_OTHER): Payer: Medicare PPO | Admitting: Family Medicine

## 2019-04-21 ENCOUNTER — Other Ambulatory Visit: Payer: Self-pay

## 2019-04-21 ENCOUNTER — Telehealth: Payer: Self-pay

## 2019-04-21 ENCOUNTER — Encounter: Payer: Self-pay | Admitting: Family Medicine

## 2019-04-21 DIAGNOSIS — G4730 Sleep apnea, unspecified: Secondary | ICD-10-CM | POA: Diagnosis not present

## 2019-04-21 NOTE — Telephone Encounter (Signed)
Noted  

## 2019-04-21 NOTE — Telephone Encounter (Signed)
Spoke with Christina from [facility] in regards to her cpap report. And she stated that Mrs. Height has not been setup on her machine. They have reached out to her 3 times and she hasn't returned their call. She stated that if she is still interested that she could call and they would be happy to set her up.

## 2019-04-21 NOTE — Progress Notes (Signed)
PATIENT: [name] DOB: 12/19/1950  REASON FOR VISIT: follow up HISTORY FROM: patient  Virtual Visit via Telephone Note  I connected with Lendon Colonel on 04/21/19 at  3:30 PM EDT by telephone and verified that I am speaking with the correct person using two identifiers.   I discussed the limitations, risks, security and privacy concerns of performing an evaluation and management service by telephone and the availability of in person appointments. I also discussed with the patient that there may be a patient responsible charge related to this service. The patient expressed understanding and agreed to proceed.   History of Present Illness:  04/21/19 [name] is a 68 y.o. female for follow up of severe OSA. She was advised to start CPAP following sleep study. DME provider reports that they have reached out several times with no response to set up CPAP. She states that she has not heard from the DME company. She has no barriers to starting CPAP.   History (copied from Dr Dohmeier's note on 02/02/2019)  HPI:  [name] is a 68 y.o. female patient seen on 01-19-2019 upon a referral  from Dr. Sherren Mocha , MD / Dr. Theda Sers, DO for a sleep evaluation.  She is a known OSA patient and currently on her second CPAP, which she uses daily. Mrs. Draughon also has a history of hypertension, she is at this time considered morbidly obese as her body mass index is approached 28, she has high cholesterol, glaucoma, she has an abdominal umbilical hernia, breast reduction surgery surgery, hysterectomy and several eye surgeries.  Also listed are chronic kidney disease but no grade was named, hypertension and allergic rhinitis.  I reviewed the current medication,  Chief complaint according to patient : " I know I have to sleep with a CPAP but I never had a new machine in over 10 years."  Sleep habits are as follows:  Dinner time is 5 PM and sleep time is 10.00 PM.  Bedroom is cool,  quiet and dark. She sleeps supine, on one pillow, in a flat bed. She dreams, but is not known to act out.  She does not snore while on CPAP, has no dry mouth, but nocturia times 2-3 times. Wakes up at 6.30 and rises- refreshed and restored. She is using nasal pillows, a bella swift with ear loops.   Sleep medical history: see above   Family sleep history: unknown.   Social history: lives alone, childless. The patient is retired but she still has a part-time job, she raised her nephew, her husband died in 24.  She is a former smoker but quit in 1982, she does not drink alcohol, she drinks coffee but mixes it 50-50 with decaf.  Observations/Objective:  Generalized: Well developed, in no acute distress  Mentation: Alert oriented to time, place, history taking. Follows all commands speech and language fluent   Assessment and Plan:  68 y.o. year old female  has a past medical history of Arthritis, Blood transfusion without reported diagnosis, Cataract, Diverticulitis, Fibroid, GERD (gastroesophageal reflux disease), Glaucoma, Heart murmur, History of echocardiogram (10/16/2011), History of palpitations, Hyperlipidemia, Hypertension, and OSA on CPAP. here with    ICD-10-CM   1. Severe sleep apnea G47.30     She was given contact information for Christina with [facility]. She states that she will call today to set up CPAP therapy. I have encouraged CPAP usage nightly and for greater than 4 hours each night. She will follow up in 4  weeks for download report. She verbalizes agreement and understanding of this plan.   No orders of the defined types were placed in this encounter.   No orders of the defined types were placed in this encounter.    Follow Up Instructions:  I discussed the assessment and treatment plan with the patient. The patient was provided an opportunity to ask questions and all were answered. The patient agreed with the plan and demonstrated an understanding of the  instructions.   The patient was advised to call back or seek an in-person evaluation if the symptoms worsen or if the condition fails to improve as anticipated.  I provided 20 minutes of non-face-to-face time during this encounter. Patient is located at her place of residence during teleconference.  Patient unable to download doxy.me a link for video visit.  Provider is located at her place of residence.  Liane Comber, RN help to facilitate visit.   Debbora Presto, NP

## 2019-04-28 ENCOUNTER — Encounter: Payer: Self-pay | Admitting: Neurology

## 2019-04-29 ENCOUNTER — Telehealth: Payer: Self-pay | Admitting: Neurology

## 2019-04-29 NOTE — Telephone Encounter (Signed)
Called the pt to make her aware that we need to push the apt out between 05/29/2019-07/27/2019 per [facility] as she was set up on 6/2. Asked the pt to call back and we can reschedule her in this time frame

## 2019-05-01 NOTE — Telephone Encounter (Signed)
Pt returned call and r/s. Pt had to be r/s with MD due to NP not having availability .

## 2019-05-28 ENCOUNTER — Ambulatory Visit: Payer: Medicare PPO | Admitting: Adult Health

## 2019-07-05 ENCOUNTER — Other Ambulatory Visit: Payer: Self-pay | Admitting: Gynecology

## 2019-07-14 ENCOUNTER — Telehealth: Payer: Self-pay | Admitting: *Deleted

## 2019-07-14 NOTE — Telephone Encounter (Signed)
Rx denied. Needs annual exam

## 2019-07-22 ENCOUNTER — Encounter: Payer: Self-pay | Admitting: Neurology

## 2019-07-22 ENCOUNTER — Other Ambulatory Visit: Payer: Self-pay

## 2019-07-22 ENCOUNTER — Ambulatory Visit (INDEPENDENT_AMBULATORY_CARE_PROVIDER_SITE_OTHER): Payer: Medicare PPO | Admitting: Neurology

## 2019-07-22 VITALS — BP 140/90 | HR 72 | Temp 98.7°F | Ht 63.0 in | Wt 226.0 lb

## 2019-07-22 DIAGNOSIS — G4733 Obstructive sleep apnea (adult) (pediatric): Secondary | ICD-10-CM

## 2019-07-22 DIAGNOSIS — G4730 Sleep apnea, unspecified: Secondary | ICD-10-CM | POA: Diagnosis not present

## 2019-07-22 DIAGNOSIS — M1711 Unilateral primary osteoarthritis, right knee: Secondary | ICD-10-CM

## 2019-07-22 DIAGNOSIS — Z9989 Dependence on other enabling machines and devices: Secondary | ICD-10-CM

## 2019-07-22 DIAGNOSIS — Z6841 Body Mass Index (BMI) 40.0 and over, adult: Secondary | ICD-10-CM

## 2019-07-22 NOTE — Progress Notes (Signed)
SLEEP MEDICINE CLINIC   Provider:  Larey Seat, MD    Primary Care Physician:  Janie Morning, DO   Referring Provider: Janie Morning, DO    Chief Complaint  Patient presents with  . Follow-up    pt alone, rm 10. pt states that things are well. D[facility]    HPI: [name] is a 68 y.o. female patient  Who last had a telephone visit with Debbora Presto , NP on 04-21-2019, at a time she was not yet set up her new CPAP.  She had undergone a HST on 02-02-2019.  :STUDY RESULTS:  Total Recording Time: 9 h,1 min; Total Sleep Time: 7 h 29 min. Total Apnea/Hypopnea Index (AHI): 32.8 /h; RDI: 33.2 /h; REM AHI:  49.0/h. Average Oxygen Saturation:  95%; Lowest Oxygen Desaturation: 87 %.  Total Time Oxygen Saturation below 89 %: 0.2 minutes.  Average Heart Rate: 61 bpm (between 54 and 78 bpm). IMPRESSION:  Severe OSA at AHI of 32.8/h with some REM accentuation to 49/h. No clinically significant hypoxemia.  Regular heart rate.  RECOMMENDATION: The patient requested a new mask to be fitted, she will continue CPAP therapy with an autotitration machine, heated humidity and a setting from 6-16 cm water, 1 cm EPR.  Larey Seat, M.D.  02-10-2019  follow up on CPAP today- [name] presents today with excellent compliance data.  She has used her CPAP 100% of the last 30 days by days and by hours the last night recorded was 19 July 2019.  Her AutoSet CPAP is between 6 and 16 cm water pressure with 3 cm EPR, her residual AHI is 0.5/h.  This is an excellent resolution with no central apneas arising.   The 95th percentile pressure was 10.4 cmH2O and felt well within the currently prescribed pressure window. Based on her compliance data I am extremely happy.       [name] is a 68 y.o. female patient seen on 01-19-2019 upon a referral  from Dr. Theda Sers , MD / Dr. Theda Sers, DO for a sleep evaluation.  She is a known OSA patient and currently on her second CPAP, which she uses  daily. [name] also has a history of hypertension, she is at this time considered morbidly obese as her body mass index is approached 105, she has high cholesterol, glaucoma, she has an abdominal umbilical hernia, breast reduction surgery surgery, hysterectomy and several eye surgeries.  Also listed are chronic kidney disease but no grade was named, hypertension and allergic rhinitis.  I reviewed the current medication,  Chief complaint according to patient : " I know I have to sleep with a CPAP but I never had a new machine in over 10 years."  Sleep habits are as follows:  Dinner time is 5 PM and sleep time is 10.00 PM.  Bedroom is cool, quiet and dark. She sleeps supine, on one pillow, in a flat bed. She dreams, but is not known to act out.  She does not snore while on CPAP, has no dry mouth, but nocturia times 2-3 times. Wakes up at 6.30 and rises- refreshed and restored. She is using nasal pillows, a bella swift with ear loops.   Sleep medical history: see above   Family sleep history: unknown.   Social history: lives alone, childless. The patient is retired but she still has a part-time job, she raised her nephew, her husband died in 95.  She is a former smoker but quit in 1982, she  does not drink alcohol, she drinks coffee but mixes it 50-50 with decaf.  Review of Systems: Out of a complete 14 system review, the patient complains of only the following symptoms, and all other reviewed systems are negative. " I want to have a mouth piece or different mask. "  She used to go to Dr. Debara Pickett, cardiology for palpitations but found out that these were related to the ingestion of green tea.  She endorsed an Epworth Sleepiness Scale at 4 points, the fatigue severity score at 15 points,   she is a compliant CPAP user her geriatric depression score was endorsed at only 1 out of 15 points she has been using the CPAP machine 97% of the time this is an S9 Elite model set at 9 cmH2O with 3 cm  EPR and she achieves a residual AHI of 2.9 there are some air leaks there for more than half of all nights but no central apneas are recorded.   She is definitely due for a new year and probably a new machine..       Social History   Socioeconomic History  . Marital status: Widowed    Spouse name: Not on file  . Number of children: Not on file  . Years of education: Not on file  . Highest education level: Not on file  Occupational History  . Not on file  Social Needs  . Financial resource strain: Not on file  . Food insecurity    Worry: Not on file    Inability: Not on file  . Transportation needs    Medical: Not on file    Non-medical: Not on file  Tobacco Use  . Smoking status: Former Smoker    Quit date: 11/26/1980    Years since quitting: 38.6  . Smokeless tobacco: Never Used  Substance and Sexual Activity  . Alcohol use: No    Alcohol/week: 0.0 standard drinks  . Drug use: No  . Sexual activity: Not Currently    Birth control/protection: Surgical    Comment: 1st intercourse 68 yo-Fewer than 5 partners  Lifestyle  . Physical activity    Days per week: Not on file    Minutes per session: Not on file  . Stress: Not on file  Relationships  . Social Herbalist on phone: Not on file    Gets together: Not on file    Attends religious service: Not on file    Active member of club or organization: Not on file    Attends meetings of clubs or organizations: Not on file    Relationship status: Not on file  . Intimate partner violence    Fear of current or ex partner: Not on file    Emotionally abused: Not on file    Physically abused: Not on file    Forced sexual activity: Not on file  Other Topics Concern  . Not on file  Social History Narrative  . Not on file    Family History  Problem Relation Age of Onset  . Cancer Mother        Bladder cancer  . Hypertension Brother   . COPD Brother   . Hypertension Sister   . Breast cancer Sister        Age  31  . Cancer Sister        Bladder cancer  . Stroke Maternal Grandmother   . Colon cancer Neg Hx     Past Medical History:  Diagnosis  Date  . Arthritis   . Blood transfusion without reported diagnosis   . Cataract   . Diverticulitis   . Fibroid   . GERD (gastroesophageal reflux disease)    occ  . Glaucoma   . Heart murmur   . History of echocardiogram 10/16/2011   EF >55%; borderline concentric LVH; mild MR, mild TR  . History of palpitations   . Hyperlipidemia   . Hypertension   . OSA on CPAP     Past Surgical History:  Procedure Laterality Date  . ABDOMINAL HYSTERECTOMY  1991   TAH,BSO  . BREAST SURGERY     REDUCTION MAMMAPLASTY  . EYE SURGERY  2010  . EYE SURGERY     shunt due to glaucoma  . HERNIA REPAIR    . INSERTION OF AHMED VALVE Left 05/04/2015   Procedure: REVISION AHMED VALVE LEFT EYE WITH TUTOPLAST AND AMNIOGRAFT;  Surgeon: Marylynn Pearson, MD;  Location: [facility];  Service: Ophthalmology;  Laterality: Left;  . JOINT REPLACEMENT    . OOPHORECTOMY     BSO  . REPLACEMENT TOTAL KNEE Right 2008  . TONSILLECTOMY      Current Outpatient Medications  Medication Sig Dispense Refill  . acetaminophen (TYLENOL) 500 MG tablet Take 500 mg by mouth every 6 (six) hours as needed for mild pain or moderate pain.    . Brimonidine Tartrate-Timolol (COMBIGAN OP) Apply to eye.    . cholecalciferol (VITAMIN D) 1000 UNITS tablet Take 1,000 Units by mouth daily.    . dorzolamide-timolol (COSOPT) 22.3-6.8 MG/ML ophthalmic solution INSTILL 1 DROP INTO RIGHT EYE TWICE A DAY    . estradiol (ESTRACE) 0.5 MG tablet TAKE 1 TABLET (0.5 MG TOTAL) BY MOUTH DAILY. 90 tablet 4  . fish oil-omega-3 fatty acids 1000 MG capsule Take 2 g by mouth daily.    Marland Kitchen GARLIC PO Take by mouth.    . hydrochlorothiazide (HYDRODIURIL) 25 MG tablet TAKE 1 TABLET BY MOUTH EVERY DAY 90 tablet 0  . Magnesium (V-R MAGNESIUM) 250 MG TABS Take by mouth.    . vitamin E 1000 UNIT capsule Take 1,000 Units by mouth daily.      No current facility-administered medications for this visit.     Allergies as of 07/22/2019 - Review Complete 07/22/2019  Allergen Reaction Noted  . Aspirin  09/06/2011  . Sulfa antibiotics Hives 07/24/2011    Vitals: BP 140/90   Pulse 72   Temp 98.7 F (37.1 C)   Ht 5\' 3"  (1.6 m)   Wt 226 lb (102.5 kg)   BMI 40.03 kg/m  Last Weight:  Wt Readings from Last 1 Encounters:  07/22/19 226 lb (102.5 kg)   [id number] mass index is 40.03 kg/m.     Last Height:   Ht Readings from Last 1 Encounters:  07/22/19 5\' 3"  (1.6 m)    Physical exam:  General: The patient is awake, alert and appears not in acute distress. The patient is well groomed. Head: Normocephalic, atraumatic. Neck is supple. Mallampati:4  neck circumference:17" . Nasal airflow partially congestion- , TMJ click is evident . Retrognathia is seen.  Cardiovascular:  Regular rate and rhythm, without  murmurs or carotid bruit, and without distended neck veins. Respiratory: Lungs are clear to auscultation. Skin:  Without evidence of edema, or rash Trunk: BMI is 39.68 . The patient's posture is erect.   Neurologic exam : The patient is awake and alert, oriented to place and time.   Attention span & concentration ability appears normal.  Speech is fluent,  without  dysarthria, dysphonia or aphasia.  Mood and affect are appropriate.  Cranial nerves: Pupils are equal and briskly reactive to light.   Facial motor strength is symmetric and tongue and uvula move midline. Shoulder shrug was symmetrical.  Motor exam:  Normal tone, muscle bulk and symmetric strength in all extremities. Sensory:  Fine touch, pinprick and vibration were tested as normal in all extremities.  Coordination:  Finger-to-nose maneuver  normal without evidence of ataxia, dysmetria or tremor. Gait and station: Patient walks without assistive device  Stance is stable and normal. Turns with  3 Steps.  Deep tendon reflexes: in the  upper and lower  extremities are symmetric and intact. Babinski maneuver response is downgoing.    Assessment:  After physical and neurologic examination, review of laboratory studies,  Personal review of imaging studies, reports of other /same  Imaging studies, results of polysomnography and / or neurophysiology testing and pre-existing records as far as provided in visit., my assessment is:   1) Severe OSA well controlled on autotitration CPAP device . She is sleeping well, has no fatigue concern and would like to lose weight.  She is considering a medical weight and wellness clinic, but also knows her exercise regiman is not what is was.  I like for her to get her metabolic rate tested.   2) Her Epworth sleepiness score was endorsed at 2 and her fatigue severity score at 11 points a geriatric depression score was also obtained and only endorsed at 2 out of 15 points.    I spent more than 15 minutes of face to face time with the patient.  Greater than 50% of time was spent in counseling and coordination of care. We have discussed the diagnosis and differential and I answered the patient's questions.       Larey Seat, MD [id number], 0000000 PM  Certified in Neurology by ABPN Certified in [place] by [facility] [address] [place], [place] [address]

## 2019-09-02 ENCOUNTER — Encounter: Payer: Self-pay | Admitting: Gynecology

## 2020-02-10 ENCOUNTER — Encounter: Payer: Self-pay | Admitting: Gastroenterology

## 2020-02-15 ENCOUNTER — Encounter (INDEPENDENT_AMBULATORY_CARE_PROVIDER_SITE_OTHER): Payer: Self-pay

## 2020-02-24 ENCOUNTER — Ambulatory Visit (INDEPENDENT_AMBULATORY_CARE_PROVIDER_SITE_OTHER): Payer: Medicare PPO | Admitting: Bariatrics

## 2020-03-09 ENCOUNTER — Ambulatory Visit (INDEPENDENT_AMBULATORY_CARE_PROVIDER_SITE_OTHER): Payer: Medicare PPO | Admitting: Bariatrics

## 2020-05-17 DIAGNOSIS — H26491 Other secondary cataract, right eye: Secondary | ICD-10-CM | POA: Insufficient documentation

## 2020-07-25 ENCOUNTER — Other Ambulatory Visit: Payer: Self-pay

## 2020-07-25 ENCOUNTER — Encounter: Payer: Self-pay | Admitting: Family Medicine

## 2020-07-25 ENCOUNTER — Ambulatory Visit: Payer: Medicare PPO | Admitting: Family Medicine

## 2020-07-25 VITALS — BP 138/87 | HR 103 | Ht 63.0 in | Wt 225.0 lb

## 2020-07-25 DIAGNOSIS — G4733 Obstructive sleep apnea (adult) (pediatric): Secondary | ICD-10-CM | POA: Diagnosis not present

## 2020-07-25 DIAGNOSIS — Z9989 Dependence on other enabling machines and devices: Secondary | ICD-10-CM | POA: Diagnosis not present

## 2020-07-25 NOTE — Patient Instructions (Signed)
Please continue using your CPAP regularly. While your insurance requires that you use CPAP at least 4 hours each night on 70% of the nights, I recommend, that you not skip any nights and use it throughout the night if you can. Getting used to CPAP and staying with the treatment long term does take time and patience and discipline. Untreated obstructive sleep apnea when it is moderate to severe can have an adverse impact on cardiovascular health and raise her risk for heart disease, arrhythmias, hypertension, congestive heart failure, stroke and diabetes. Untreated obstructive sleep apnea causes sleep disruption, nonrestorative sleep, and sleep deprivation. This can have an impact on your day to day functioning and cause daytime sleepiness and impairment of cognitive function, memory loss, mood disturbance, and problems focussing. Using CPAP regularly can improve these symptoms.   Follow up in 1 year   Sleep Apnea Sleep apnea affects breathing during sleep. It causes breathing to stop for a short time or to become shallow. It can also increase the risk of:  Heart attack.  Stroke.  Being very overweight (obese).  Diabetes.  Heart failure.  Irregular heartbeat. The goal of treatment is to help you breathe normally again. What are the causes? There are three kinds of sleep apnea:  Obstructive sleep apnea. This is caused by a blocked or collapsed airway.  Central sleep apnea. This happens when the brain does not send the right signals to the muscles that control breathing.  Mixed sleep apnea. This is a combination of obstructive and central sleep apnea. The most common cause of this condition is a collapsed or blocked airway. This can happen if:  Your throat muscles are too relaxed.  Your tongue and tonsils are too large.  You are overweight.  Your airway is too small. What increases the risk?  Being overweight.  Smoking.  Having a small airway.  Being older.  Being  female.  Drinking alcohol.  Taking medicines to calm yourself (sedatives or tranquilizers).  Having family members with the condition. What are the signs or symptoms?  Trouble staying asleep.  Being sleepy or tired during the day.  Getting angry a lot.  Loud snoring.  Headaches in the morning.  Not being able to focus your mind (concentrate).  Forgetting things.  Less interest in sex.  Mood swings.  Personality changes.  Feelings of sadness (depression).  Waking up a lot during the night to pee (urinate).  Dry mouth.  Sore throat. How is this diagnosed?  Your medical history.  A physical exam.  A test that is done when you are sleeping (sleep study). The test is most often done in a sleep lab but may also be done at home. How is this treated?   Sleeping on your side.  Using a medicine to get rid of mucus in your nose (decongestant).  Avoiding the use of alcohol, medicines to help you relax, or certain pain medicines (narcotics).  Losing weight, if needed.  Changing your diet.  Not smoking.  Using a machine to open your airway while you sleep, such as: ? An oral appliance. This is a mouthpiece that shifts your lower jaw forward. ? A CPAP device. This device blows air through a mask when you breathe out (exhale). ? An EPAP device. This has valves that you put in each nostril. ? A BPAP device. This device blows air through a mask when you breathe in (inhale) and breathe out.  Having surgery if other treatments do not work. It is   important to get treatment for sleep apnea. Without treatment, it can lead to:  High blood pressure.  Coronary artery disease.  In men, not being able to have an erection (impotence).  Reduced thinking ability. Follow these instructions at home: Lifestyle  Make changes that your doctor recommends.  Eat a healthy diet.  Lose weight if needed.  Avoid alcohol, medicines to help you relax, and some pain  medicines.  Do not use any products that contain nicotine or tobacco, such as cigarettes, e-cigarettes, and chewing tobacco. If you need help quitting, ask your doctor. General instructions  Take over-the-counter and prescription medicines only as told by your doctor.  If you were given a machine to use while you sleep, use it only as told by your doctor.  If you are having surgery, make sure to tell your doctor you have sleep apnea. You may need to bring your device with you.  Keep all follow-up visits as told by your doctor. This is important. Contact a doctor if:  The machine that you were given to use during sleep bothers you or does not seem to be working.  You do not get better.  You get worse. Get help right away if:  Your chest hurts.  You have trouble breathing in enough air.  You have an uncomfortable feeling in your back, arms, or stomach.  You have trouble talking.  One side of your body feels weak.  A part of your face is hanging down. These symptoms may be an emergency. Do not wait to see if the symptoms will go away. Get medical help right away. Call your local emergency services (911 in the [place] Do not drive yourself to the hospital. Summary  This condition affects breathing during sleep.  The most common cause is a collapsed or blocked airway.  The goal of treatment is to help you breathe normally while you sleep. This information is not intended to replace advice given to you by your health care provider. Make sure you discuss any questions you have with your health care provider. Document Revised: 08/29/2018 Document Reviewed: 07/08/2018 Elsevier Patient Education  2020 [employer]  

## 2020-07-25 NOTE — Progress Notes (Signed)
PATIENT: [name] DOB: 07/29/1951  REASON FOR VISIT: follow up HISTORY FROM: patient  Chief Complaint  Patient presents with  . Follow-up    rm 1  . Sleep Apnea    Pt is having no new sx with OSA     HISTORY OF PRESENT ILLNESS: Today 07/25/20 [name] is a 69 y.o. female here today for follow up for OSA on CPAP. She is doing very well. She reports that she uses her machine every night. She was caring for her aunt for 2 weeks and was not able to use her machine due to getting up and down all night. Otherwise, she has been exceptionally compliant. She does feel better rested and more energized on therapy. She is working part time in [employer] at an [employer].   Compliance report dated 06/24/2020 through 07/23/2020 reveals that she used CPAP 23 of the past 30 days for compliance of 77%.  She used CPAP greater than 4 hours all 23 of the past 30 days, again for compliance of 77%.  Average usage on days used was 8 hours and 38 minutes.  Residual AHI was 0.6 on 6 to 16 cm of water pressure and an EPR of 3.  There was no significant leak noted.   HISTORY: (copied from Dr Dohmeier's note on 07/22/2019)  HPI: [name] is a 69 y.o. female patient  Who last had a telephone visit with Debbora Presto , NP on 04-21-2019, at a time she was not yet set up her new CPAP.  She had undergone a HST on 02-02-2019.  :STUDY RESULTS: Total Recording Time: 9 h,1 min; Total Sleep Time: 7 h 29 min. Total Apnea/Hypopnea Index(AHI): 32.8 /h; RDI: 33.2 /h; REM AHI: 49.0/h. Average Oxygen Saturation: 95%; Lowest Oxygen Desaturation: 87 %.  Total Time Oxygen Saturation below 89%: 0.2 minutes.  Average Heart Rate: 61 bpm (between 54 and 78 bpm). IMPRESSION: Severe OSA at AHI of 32.8/h with some REM accentuation to 49/h. No clinically significant hypoxemia. Regular heart rate.  RECOMMENDATION: The patient requested a new mask to be fitted, she will continue CPAP therapy with an  autotitration machine, heated humidity and a setting from 6-16 cm water, 1 cm EPR.  Larey Seat, M.D. 02-10-2019  follow up on CPAP today- Mrs. Humphres presents today with excellent compliance data.  She has used her CPAP 100% of the last 30 days by days and by hours the last night recorded was 19 July 2019.  Her AutoSet CPAP is between 6 and 16 cm water pressure with 3 cm EPR, her residual AHI is 0.5/h.  This is an excellent resolution with no central apneas arising.   The 95th percentile pressure was 10.4 cmH2O and felt well within the currently prescribed pressure window. Based on her compliance data I am extremely happy.   [name] is a 69 y.o. female patient seen on 01-19-2019 upon a referral  from Dr. Theda Sers , MD / Dr. Theda Sers, DO for a sleep evaluation.  She is a known OSA patient and currently on her second CPAP, which she uses daily. Mrs. Basque also has a history of hypertension, she is at this time considered morbidly obese as her body mass index is approached 106, she has high cholesterol, glaucoma, she has an abdominal umbilical hernia, breast reduction surgery surgery, hysterectomy and several eye surgeries.  Also listed are chronic kidney disease but no grade was named, hypertension and allergic rhinitis.  I reviewed the  current medication,  Chief complaint according to patient : " I know I have to sleep with a CPAP but I never had a new machine in over 10 years."  Sleep habits are as follows:  Dinner time is 5 PM and sleep time is 10.00 PM.  Bedroom is cool, quiet and dark. She sleeps supine, on one pillow, in a flat bed. She dreams, but is not known to act out.  She does not snore while on CPAP, has no dry mouth, but nocturia times 2-3 times. Wakes up at 6.30 and rises- refreshed and restored. She is using nasal pillows, a bella swift with ear loops.   Sleep medical history: see above   Family sleep history: unknown.  Social history: lives alone,  childless. The patient is retired but she still has a part-time job, she raised her nephew, her husband died in 24.  She is a former smoker but quit in 1982, she does not drink alcohol, she drinks coffee but mixes it 50-50 with decaf.   REVIEW OF SYSTEMS: Out of a complete 14 system review of symptoms, the patient complains only of the following symptoms, none and all other reviewed systems are negative.  ESS: 2 FSS: 19  ALLERGIES: Allergies  Allergen Reactions  . Aspirin     Can not take Aspirin and Sulfa in combination  . Sulfa Antibiotics Hives    HOME MEDICATIONS: Outpatient Medications Prior to Visit  Medication Sig Dispense Refill  . acetaminophen (TYLENOL) 500 MG tablet Take 500 mg by mouth every 6 (six) hours as needed for mild pain or moderate pain.    . Brimonidine Tartrate-Timolol (COMBIGAN OP) Apply to eye.    . cholecalciferol (VITAMIN D) 1000 UNITS tablet Take 2,000 Units by mouth daily.     . dorzolamide-timolol (COSOPT) 22.3-6.8 MG/ML ophthalmic solution INSTILL 1 DROP INTO RIGHT EYE TWICE A DAY    . estradiol (ESTRACE) 0.5 MG tablet TAKE 1 TABLET (0.5 MG TOTAL) BY MOUTH DAILY. 90 tablet 4  . fish oil-omega-3 fatty acids 1000 MG capsule Take 2 g by mouth daily.    Marland Kitchen GARLIC PO Take by mouth.    . hydrochlorothiazide (HYDRODIURIL) 25 MG tablet TAKE 1 TABLET BY MOUTH EVERY DAY (Patient taking differently: Takes 12.5mg ) 90 tablet 0  . Magnesium (V-R MAGNESIUM) 250 MG TABS Take by mouth.    . vitamin E 1000 UNIT capsule Take 1,000 Units by mouth daily.     No facility-administered medications prior to visit.    PAST MEDICAL HISTORY: Past Medical History:  Diagnosis Date  . Arthritis   . Blood transfusion without reported diagnosis   . Cataract   . Diverticulitis   . Fibroid   . GERD (gastroesophageal reflux disease)    occ  . Glaucoma   . Heart murmur   . History of echocardiogram 10/16/2011   EF >55%; borderline concentric LVH; mild MR, mild TR  . History  of palpitations   . Hyperlipidemia   . Hypertension   . OSA on CPAP     PAST SURGICAL HISTORY: Past Surgical History:  Procedure Laterality Date  . ABDOMINAL HYSTERECTOMY  1991   TAH,BSO  . BREAST SURGERY     REDUCTION MAMMAPLASTY  . EYE SURGERY  2010  . EYE SURGERY     shunt due to glaucoma  . HERNIA REPAIR    . INSERTION OF AHMED VALVE Left 05/04/2015   Procedure: REVISION AHMED VALVE LEFT EYE WITH TUTOPLAST AND AMNIOGRAFT;  Surgeon: Marylynn Pearson,  MD;  Location: [facility];  Service: Ophthalmology;  Laterality: Left;  . JOINT REPLACEMENT    . OOPHORECTOMY     BSO  . REPLACEMENT TOTAL KNEE Right 2008  . TONSILLECTOMY      FAMILY HISTORY: Family History  Problem Relation Age of Onset  . Cancer Mother        Bladder cancer  . Hypertension Brother   . COPD Brother   . Hypertension Sister   . Breast cancer Sister        Age 62  . Cancer Sister        Bladder cancer  . Stroke Maternal Grandmother   . Colon cancer Neg Hx     SOCIAL HISTORY: Social History   Socioeconomic History  . Marital status: Widowed    Spouse name: Not on file  . Number of children: Not on file  . Years of education: Not on file  . Highest education level: Not on file  Occupational History  . Not on file  Tobacco Use  . Smoking status: Former Smoker    Quit date: 11/26/1980    Years since quitting: 39.6  . Smokeless tobacco: Never Used  Vaping Use  . Vaping Use: Never used  Substance and Sexual Activity  . Alcohol use: No    Alcohol/week: 0.0 standard drinks  . Drug use: No  . Sexual activity: Not Currently    Birth control/protection: Surgical    Comment: 1st intercourse 69 yo-Fewer than 5 partners  Other Topics Concern  . Not on file  Social History Narrative  . Not on file   Social Determinants of Health   Financial Resource Strain:   . Difficulty of Paying Living Expenses: Not on file  Food Insecurity:   . Worried About Charity fundraiser in the Last Year: Not on file  . Ran  Out of Food in the Last Year: Not on file  Transportation Needs:   . Lack of Transportation (Medical): Not on file  . Lack of Transportation (Non-Medical): Not on file  Physical Activity:   . Days of Exercise per Week: Not on file  . Minutes of Exercise per Session: Not on file  Stress:   . Feeling of Stress : Not on file  Social Connections:   . Frequency of Communication with Friends and Family: Not on file  . Frequency of Social Gatherings with Friends and Family: Not on file  . Attends Religious Services: Not on file  . Active Member of Clubs or Organizations: Not on file  . Attends Archivist Meetings: Not on file  . Marital Status: Not on file  Intimate Partner Violence:   . Fear of Current or Ex-Partner: Not on file  . Emotionally Abused: Not on file  . Physically Abused: Not on file  . Sexually Abused: Not on file      PHYSICAL EXAM  Vitals:   07/25/20 1346  BP: 138/87  Pulse: (!) 103  Weight: 225 lb (102.1 kg)  Height: 5\' 3"  (1.6 m)   Body mass index is 39.86 kg/m.  Generalized: Well developed, in no acute distress  Cardiology: normal rate and rhythm, no murmur noted Respiratory: clear to auscultation bilaterally  Neurological examination  Mentation: Alert oriented to time, place, history taking. Follows all commands speech and language fluent Cranial nerve II-XII: Pupils were equal round reactive to light. Extraocular movements were full, visual field were full  Motor: The motor testing reveals 5 over 5 strength of all 4  extremities. Good symmetric motor tone is noted throughout.  Gait and station: Gait is normal.    DIAGNOSTIC DATA (LABS, IMAGING, TESTING) - I reviewed patient records, labs, notes, testing and imaging myself where available.  No flowsheet data found.   Lab Results  Component Value Date   WBC 4.2 05/04/2015   HGB 12.9 05/04/2015   HCT 40.0 05/04/2015   MCV 88.9 05/04/2015   PLT 196 05/04/2015      Component Value  Date/Time   NA 138 05/04/2015 0735   K 3.8 05/04/2015 0735   CL 103 05/04/2015 0735   CO2 26 05/04/2015 0735   GLUCOSE 101 (H) 05/04/2015 0735   BUN 14 05/04/2015 0735   CREATININE 0.98 05/04/2015 0735   CALCIUM 9.7 05/04/2015 0735   PROT 7.6 05/05/2013 1603   ALBUMIN 3.9 05/05/2013 1603   AST 22 05/05/2013 1603   ALT 18 05/05/2013 1603   ALKPHOS 100 05/05/2013 1603   BILITOT 0.9 05/05/2013 1603   GFRNONAA >60 05/04/2015 0735   GFRAA >60 05/04/2015 0735   Lab Results  Component Value Date   CHOL 250 (H) 05/05/2013   HDL 41.50 05/05/2013   LDLDIRECT 197.1 05/05/2013   TRIG 100.0 05/05/2013   CHOLHDL 6 05/05/2013   No results found for: HGBA1C No results found for: VITAMINB12 Lab Results  Component Value Date   TSH 1.11 05/05/2013       ASSESSMENT AND PLAN 69 y.o. year old female  has a past medical history of Arthritis, Blood transfusion without reported diagnosis, Cataract, Diverticulitis, Fibroid, GERD (gastroesophageal reflux disease), Glaucoma, Heart murmur, History of echocardiogram (10/16/2011), History of palpitations, Hyperlipidemia, Hypertension, and OSA on CPAP. here with     ICD-10-CM   1. OSA on CPAP  G47.33 For home use only DME continuous positive airway pressure (CPAP)   Z99.89     Ms. Labuda is doing very well with CPAP therapy.  Compliance report reveals acceptable compliance.  Patient did miss a week of therapy due to caring for her aunt.  Review of previous compliance report reveals excellent compliance.  She was encouraged to continue using CPAP nightly and for greater than 4 hours each night.  She will continue healthy lifestyle habits.  We will update supply orders today.  She will follow-up with me in 1 year, sooner if needed.  She verbalizes understanding and agreement with this plan.   Orders Placed This Encounter  Procedures  . For home use only DME continuous positive airway pressure (CPAP)    Supplies    Order Specific Question:   Length  of Need    Answer:   Lifetime    Order Specific Question:   Patient has OSA or probable OSA    Answer:   Yes    Order Specific Question:   Is the patient currently using CPAP in the home    Answer:   Yes    Order Specific Question:   Settings    Answer:   Other see comments    Order Specific Question:   CPAP supplies needed    Answer:   Mask, headgear, cushions, filters, heated tubing and water chamber     No orders of the defined types were placed in this encounter.     I spent 15 minutes with the patient. 50% of this time was spent counseling and educating patient on plan of care and medications.    Debbora Presto, FNP-C 07/25/2020, 2:18 PM [facility] [address]  [place], [place] [address] [phone]

## 2020-07-29 ENCOUNTER — Ambulatory Visit: Payer: Medicare PPO | Admitting: Internal Medicine

## 2020-07-29 ENCOUNTER — Other Ambulatory Visit: Payer: Self-pay

## 2020-07-29 ENCOUNTER — Encounter: Payer: Self-pay | Admitting: Internal Medicine

## 2020-07-29 VITALS — BP 134/76 | HR 70 | Ht 63.0 in | Wt 223.4 lb

## 2020-07-29 DIAGNOSIS — I10 Essential (primary) hypertension: Secondary | ICD-10-CM | POA: Diagnosis not present

## 2020-07-29 DIAGNOSIS — E782 Mixed hyperlipidemia: Secondary | ICD-10-CM | POA: Diagnosis not present

## 2020-07-29 DIAGNOSIS — G4733 Obstructive sleep apnea (adult) (pediatric): Secondary | ICD-10-CM

## 2020-07-29 DIAGNOSIS — Z9989 Dependence on other enabling machines and devices: Secondary | ICD-10-CM

## 2020-07-29 DIAGNOSIS — E663 Overweight: Secondary | ICD-10-CM

## 2020-07-29 NOTE — Progress Notes (Signed)
OFFICE NOTE  Chief Complaint:  Re-establish care  Primary Care Physician: Janie Morning, DO  HPI:  [name] is a 69 year old female with a history of hypertension, dyslipidemia, obstructive sleep apnea on CPAP and palpitations. Recently, she reported her palpitations have improved, and we discussed in the past about possibly starting low-dose atenolol. However, she never had that prescription filled. One thing she pointed out is that she has been drinking Aqua Blue distilled water for some period of time. It is actually interesting because that water contains no minerals, and she may get very few minerals in her diet. Recently, she started taking mineral supplements, and she has reported that her palpitations have improved, suggesting it may be related to a mineral deficiency. Overall, she sleeps well with her CPAP and feels that that is working well for her until recently, when her machine broke. She has been using her cousin's. She also has dyslipidemia and her cholesterol is high on recent blood draw. This may be due to recent weight gain.  07/29/2020  [name] is seen today to reestablish care.  I last saw her in 2014 and therefore she is considered a new patient.  As above past medical history significant for hypertension, dyslipidemia, OSA on CPAP and some palpitations.  More recently she has had some palpitations however generally feels like they are well controlled.  EKG shows a sinus rhythm with first-degree AV block at 70.  Blood pressure is better today 134/76.  Recent lipids in May showed high LDL cholesterol 192 with total 258 and HDL 55 triglycerides 68.  Hemoglobin A1c was 5.3%.  The high cholesterol had been previously noted, however she is on no therapy for this.  Is not clear if she has had statin intolerance in the past.  PMHx:  Past Medical History:  Diagnosis Date  . Arthritis   . Blood transfusion without reported diagnosis   . Cataract   . Diverticulitis    . Fibroid   . GERD (gastroesophageal reflux disease)    occ  . Glaucoma   . Heart murmur   . History of echocardiogram 10/16/2011   EF >55%; borderline concentric LVH; mild MR, mild TR  . History of palpitations   . Hyperlipidemia   . Hypertension   . OSA on CPAP     Past Surgical History:  Procedure Laterality Date  . ABDOMINAL HYSTERECTOMY  1991   TAH,BSO  . BREAST SURGERY     REDUCTION MAMMAPLASTY  . EYE SURGERY  2010  . EYE SURGERY     shunt due to glaucoma  . HERNIA REPAIR    . INSERTION OF AHMED VALVE Left 05/04/2015   Procedure: REVISION AHMED VALVE LEFT EYE WITH TUTOPLAST AND AMNIOGRAFT;  Surgeon: Marylynn Pearson, MD;  Location: [facility];  Service: Ophthalmology;  Laterality: Left;  . JOINT REPLACEMENT    . OOPHORECTOMY     BSO  . REPLACEMENT TOTAL KNEE Right 2008  . TONSILLECTOMY      FAMHx:  Family History  Problem Relation Age of Onset  . Cancer Mother        Bladder cancer  . Hypertension Brother   . COPD Brother   . Hypertension Sister   . Breast cancer Sister        Age 35  . Cancer Sister        Bladder cancer  . Stroke Maternal Grandmother   . Colon cancer Neg Hx     SOCHx:   reports that she  quit smoking about 39 years ago. She has never used smokeless tobacco. She reports that she does not drink alcohol and does not use drugs.  ALLERGIES:  Allergies  Allergen Reactions  . Aspirin     Can not take Aspirin and Sulfa in combination  . Sulfa Antibiotics Hives    ROS: Pertinent items noted in HPI and remainder of comprehensive ROS otherwise negative.  HOME MEDS: Current Outpatient Medications  Medication Sig Dispense Refill  . acetaminophen (TYLENOL) 500 MG tablet Take 500 mg by mouth every 6 (six) hours as needed for mild pain or moderate pain.    . Brimonidine Tartrate-Timolol (COMBIGAN OP) Apply to eye.    . cholecalciferol (VITAMIN D) 1000 UNITS tablet Take 2,000 Units by mouth daily.     . dorzolamide-timolol (COSOPT) 22.3-6.8 MG/ML  ophthalmic solution INSTILL 1 DROP INTO RIGHT EYE TWICE A DAY    . estradiol (ESTRACE) 0.5 MG tablet TAKE 1 TABLET (0.5 MG TOTAL) BY MOUTH DAILY. 90 tablet 4  . fish oil-omega-3 fatty acids 1000 MG capsule Take 2 g by mouth daily.    Marland Kitchen GARLIC PO Take by mouth.    . hydrochlorothiazide (HYDRODIURIL) 25 MG tablet TAKE 1 TABLET BY MOUTH EVERY DAY (Patient taking differently: Takes 12.5mg ) 90 tablet 0  . Magnesium (V-R MAGNESIUM) 250 MG TABS Take by mouth.    . vitamin E 1000 UNIT capsule Take 1,000 Units by mouth daily.     No current facility-administered medications for this visit.    LABS/IMAGING: No results found for this or any previous visit (from the past 48 hour(s)). No results found.  VITALS: BP 134/76   Pulse 70   Ht 5\' 3"  (1.6 m)   Wt 223 lb 6.4 oz (101.3 kg)   SpO2 99%   BMI 39.57 kg/m   EXAM: General appearance: alert and no distress Neck: no adenopathy, no carotid bruit, no JVD, supple, symmetrical, trachea midline and thyroid not enlarged, symmetric, no tenderness/mass/nodules Lungs: clear to auscultation bilaterally Heart: regular rate and rhythm, S1, S2 normal, no murmur, click, rub or gallop Abdomen: soft, non-tender; bowel sounds normal; no masses,  no organomegaly Extremities: extremities normal, atraumatic, no cyanosis or edema Pulses: 2+ and symmetric Skin: Skin color, texture, turgor normal. No rashes or lesions Neurologic: Grossly normal  EKG: Sinus rhythm first-degree AV block at 70-personally reviewed  ASSESSMENT: 1. Obstructive sleep apnea on CPAP 2. Hypertension 3. Dyslipidemia- ?familial hyperlipidemia 4. Palpitations 5. Morbid obesity  PLAN: 1.   [name] is here to reestablish care today.  She reports some infrequent palpitations.  Blood pressure is better controlled.  She has a sleep apnea on CPAP.  Weight is down somewhat but just under a BMI of 40.  There is significant dyslipidemia not on therapy.  This could represent a familial  hyperlipidemia.  DL over 190, current guidelines recommend high potency statin therapy with LDL reduction less than 50%.  She reported being very hesitant about starting statin therapy.  She questions whether she could have any coronary disease at all however I think it is fairly likely.  I discussed the possibility of a coronary artery calcium score which she seemed amenable to.  If this is significantly abnormal then I would strongly urge high potency statin therapy or if not tolerated we could consider PCSK9 inhibitor.  Plan follow-up with me in 3 months with repeat lipids.  Pixie Casino, MD, [facility], [facility] Director of the Advanced  Lipid Disorders &  Cardiovascular Risk Reduction Clinic Diplomate of the American Board of Clinical Lipidology Attending Cardiologist  Direct Dial: [phone]  Fax: [phone]  Website:  [email or web address]  07/29/2020, 4:15 PM

## 2020-07-29 NOTE — Patient Instructions (Signed)
Medication Instructions:  No Changes In Medications at this time.  *If you need a refill on your cardiac medications before your next appointment, please call your pharmacy*   Lab Work: No Changes In Medications at this time.  If you have labs (blood work) drawn today and your tests are completely normal, you will receive your results only by: Marland Kitchen MyChart Message (if you have MyChart) OR . A paper copy in the mail If you have any lab test that is abnormal or we need to change your treatment, we will call you to review the results.   Testing/Procedures: Dr. Debara Pickett has ordered a CT coronary calcium score. This test is done at [address]. This is $150 out of pocket.  Coronary CalciumScan A coronary calcium scan is an imaging test used to look for deposits of calcium and other fatty materials (plaques) in the inner lining of the blood vessels of the heart (coronary arteries). These deposits of calcium and plaques can partly clog and narrow the coronary arteries without producing any symptoms or warning signs. This puts a person at risk for a heart attack. This test can detect these deposits before symptoms develop. Tell a health care provider about:  Any allergies you have.  All medicines you are taking, including vitamins, herbs, eye drops, creams, and over-the-counter medicines.  Any problems you or family members have had with anesthetic medicines.  Any blood disorders you have.  Any surgeries you have had.  Any medical conditions you have.  Whether you are pregnant or may be pregnant. What are the risks? Generally, this is a safe procedure. However, problems may occur, including:  Harm to a pregnant woman and her unborn baby. This test involves the use of radiation. Radiation exposure can be dangerous to a pregnant woman and her unborn baby. If you are pregnant, you generally should not have this procedure done.  Slight increase in the risk of cancer. This is  because of the radiation involved in the test. What happens before the procedure? No preparation is needed for this procedure. What happens during the procedure?  You will undress and remove any jewelry around your neck or chest.  You will put on a hospital gown.  Sticky electrodes will be placed on your chest. The electrodes will be connected to an electrocardiogram (ECG) machine to record a tracing of the electrical activity of your heart.  A CT scanner will take pictures of your heart. During this time, you will be asked to lie still and hold your breath for 2-3 seconds while a picture of your heart is being taken. The procedure may vary among health care providers and hospitals. What happens after the procedure?  You can get dressed.  You can return to your normal activities.  It is up to you to get the results of your test. Ask your health care provider, or the department that is doing the test, when your results will be ready. Summary  A coronary calcium scan is an imaging test used to look for deposits of calcium and other fatty materials (plaques) in the inner lining of the blood vessels of the heart (coronary arteries).  Generally, this is a safe procedure. Tell your health care provider if you are pregnant or may be pregnant.  No preparation is needed for this procedure.  A CT scanner will take pictures of your heart.  You can return to your normal activities after the scan is done. This information is not  intended to replace advice given to you by your health care provider. Make sure you discuss any questions you have with your health care provider. Document Released: 05/10/2008 Document Revised: 10/01/2016 Document Reviewed: 10/01/2016 Elsevier Interactive Patient Education  2017 [place]: At [facility], you and your health needs are our priority.  As part of our continuing mission to provide you with exceptional heart care, we have created  designated Provider Care Teams.  These Care Teams include your primary Cardiologist (physician) and Advanced Practice Providers (APPs -  Physician Assistants and Nurse Practitioners) who all work together to provide you with the care you need, when you need it.  We recommend signing up for the patient portal called "MyChart".  Sign up information is provided on this After Visit Summary.  MyChart is used to connect with patients for Virtual Visits (Telemedicine).  Patients are able to view lab/test results, encounter notes, upcoming appointments, etc.  Non-urgent messages can be sent to your provider as well.   To learn more about what you can do with MyChart, go to [email or web address].    Your next appointment:   3 month(s)  The format for your next appointment:   In Person  Provider:   K. Mali Hilty, MD

## 2020-07-31 ENCOUNTER — Encounter: Payer: Self-pay | Admitting: Internal Medicine

## 2020-09-16 ENCOUNTER — Encounter: Payer: Self-pay | Admitting: Internal Medicine

## 2020-10-19 ENCOUNTER — Other Ambulatory Visit: Payer: Self-pay | Admitting: *Deleted

## 2020-10-19 ENCOUNTER — Other Ambulatory Visit: Payer: Self-pay

## 2020-10-19 ENCOUNTER — Ambulatory Visit (INDEPENDENT_AMBULATORY_CARE_PROVIDER_SITE_OTHER)
Admission: RE | Admit: 2020-10-19 | Discharge: 2020-10-19 | Disposition: A | Discharge status: Home / Self Care | Payer: Self-pay | Source: Ambulatory Visit | Attending: Internal Medicine | Admitting: Internal Medicine

## 2020-10-19 DIAGNOSIS — E782 Mixed hyperlipidemia: Secondary | ICD-10-CM

## 2020-10-19 DIAGNOSIS — I10 Essential (primary) hypertension: Secondary | ICD-10-CM

## 2020-10-19 DIAGNOSIS — R911 Solitary pulmonary nodule: Secondary | ICD-10-CM

## 2020-10-19 DIAGNOSIS — E663 Overweight: Secondary | ICD-10-CM

## 2020-10-19 MED ORDER — ATORVASTATIN CALCIUM 80 MG PO TABS: 80.0000 mg | ORAL_TABLET | Freq: Every day | ORAL | 3 refills | Status: DC

## 2020-11-03 ENCOUNTER — Encounter: Payer: Self-pay | Admitting: Internal Medicine

## 2020-11-03 ENCOUNTER — Other Ambulatory Visit: Payer: Self-pay

## 2020-11-03 ENCOUNTER — Ambulatory Visit: Payer: Medicare PPO | Admitting: Internal Medicine

## 2020-11-03 VITALS — BP 142/82 | HR 60 | Ht 63.0 in | Wt 224.4 lb

## 2020-11-03 DIAGNOSIS — I2510 Atherosclerotic heart disease of native coronary artery without angina pectoris: Secondary | ICD-10-CM

## 2020-11-03 DIAGNOSIS — Z9989 Dependence on other enabling machines and devices: Secondary | ICD-10-CM

## 2020-11-03 DIAGNOSIS — E782 Mixed hyperlipidemia: Secondary | ICD-10-CM

## 2020-11-03 DIAGNOSIS — I2584 Coronary atherosclerosis due to calcified coronary lesion: Secondary | ICD-10-CM

## 2020-11-03 DIAGNOSIS — R0602 Shortness of breath: Secondary | ICD-10-CM

## 2020-11-03 DIAGNOSIS — G4733 Obstructive sleep apnea (adult) (pediatric): Secondary | ICD-10-CM

## 2020-11-03 NOTE — Progress Notes (Signed)
OFFICE NOTE  Chief Complaint:  Follow-up calcium score  Primary Care Physician: Janie Morning, DO  HPI:  [name] is a 69 year old female with a history of hypertension, dyslipidemia, obstructive sleep apnea on CPAP and palpitations. Recently, she reported her palpitations have improved, and we discussed in the past about possibly starting low-dose atenolol. However, she never had that prescription filled. One thing she pointed out is that she has been drinking Aqua Blue distilled water for some period of time. It is actually interesting because that water contains no minerals, and she may get very few minerals in her diet. Recently, she started taking mineral supplements, and she has reported that her palpitations have improved, suggesting it may be related to a mineral deficiency. Overall, she sleeps well with her CPAP and feels that that is working well for her until recently, when her machine broke. She has been using her cousin's. She also has dyslipidemia and her cholesterol is high on recent blood draw. This may be due to recent weight gain.  07/29/2020  [name] is seen today to reestablish care.  I last saw her in 2014 and therefore she is considered a new patient.  As above past medical history significant for hypertension, dyslipidemia, OSA on CPAP and some palpitations.  More recently she has had some palpitations however generally feels like they are well controlled.  EKG shows a sinus rhythm with first-degree AV block at 70.  Blood pressure is better today 134/76.  Recent lipids in May showed high LDL cholesterol 192 with total 258 and HDL 55 triglycerides 68.  Hemoglobin A1c was 5.3%.  The high cholesterol had been previously noted, however she is on no therapy for this.  Is not clear if she has had statin intolerance in the past.  11/03/2020  [name] returns today for follow-up.  She underwent calcium scoring due to significantly elevated cholesterol.  This  demonstrated a high coronary calcium score 466, 95th percentile for age and sex matched control.  Calcium was noted in the left main, proximal LAD and circumflex arteries.  There was an incidental finding of a 7 mm pulmonary nodule in the right lower lobe which was recommended to be followed up in 6 to 12 months.  We have ordered a 60-month CT chest nodule follow-up.  She is aware of this finding.  After some discussion I had recommended starting on high potency atorvastatin.  She did not start the medication when we recommended it in November rather wanted to wait until this appointment to discuss it further.  She had a number of concerns about statins, including the potential risk for developing diabetes, the fact that she might have significant permanent disability associated with it, and that it could deplete the body of coenzyme Q 10 which she would need to take to replace that.  I addressed all of these concerns which are typically poorly understood and not scientifically based Internet fodder.  She seemed reassured and understood the importance of statin therapy with regards to significant risk reduction given her extensive coronary disease.  In addition she is interested in starting an exercise program.  It is difficult at this point since she is fairly sedentary to understand whether or not she has any obstructive coronary disease.  We discussed the possibility of stress testing to determine her risk prior to starting an intense exercise program and per current American Society of nuclear cardiology guidelines given her calcium score greater than 400, Myoview stress testing is  considered appropriate.  PMHx:  Past Medical History:  Diagnosis Date  . Arthritis   . Blood transfusion without reported diagnosis   . Cataract   . Diverticulitis   . Fibroid   . GERD (gastroesophageal reflux disease)    occ  . Glaucoma   . Heart murmur   . History of echocardiogram 10/16/2011   EF >55%; borderline  concentric LVH; mild MR, mild TR  . History of palpitations   . Hyperlipidemia   . Hypertension   . OSA on CPAP     Past Surgical History:  Procedure Laterality Date  . ABDOMINAL HYSTERECTOMY  1991   TAH,BSO  . BREAST SURGERY     REDUCTION MAMMAPLASTY  . EYE SURGERY  2010  . EYE SURGERY     shunt due to glaucoma  . HERNIA REPAIR    . INSERTION OF AHMED VALVE Left 05/04/2015   Procedure: REVISION AHMED VALVE LEFT EYE WITH TUTOPLAST AND AMNIOGRAFT;  Surgeon: Marylynn Pearson, MD;  Location: [facility];  Service: Ophthalmology;  Laterality: Left;  . JOINT REPLACEMENT    . OOPHORECTOMY     BSO  . REPLACEMENT TOTAL KNEE Right 2008  . TONSILLECTOMY      FAMHx:  Family History  Problem Relation Age of Onset  . Cancer Mother        Bladder cancer  . Hypertension Brother   . COPD Brother   . Hypertension Sister   . Breast cancer Sister        Age 53  . Cancer Sister        Bladder cancer  . Stroke Maternal Grandmother   . Colon cancer Neg Hx     SOCHx:   reports that she quit smoking about 39 years ago. She has never used smokeless tobacco. She reports that she does not drink alcohol and does not use drugs.  ALLERGIES:  Allergies  Allergen Reactions  . Aspirin     Can not take Aspirin and Sulfa in combination  . Sulfa Antibiotics Hives    ROS: Pertinent items noted in HPI and remainder of comprehensive ROS otherwise negative.  HOME MEDS: Current Outpatient Medications  Medication Sig Dispense Refill  . acetaminophen (TYLENOL) 500 MG tablet Take 500 mg by mouth every 6 (six) hours as needed for mild pain or moderate pain.    Marland Kitchen atorvastatin (LIPITOR) 80 MG tablet Take 1 tablet (80 mg total) by mouth daily. 90 tablet 3  . Brimonidine Tartrate-Timolol (COMBIGAN OP) Apply to eye.    . cholecalciferol (VITAMIN D) 1000 UNITS tablet Take 2,000 Units by mouth daily.     . dorzolamide-timolol (COSOPT) 22.3-6.8 MG/ML ophthalmic solution INSTILL 1 DROP INTO RIGHT EYE TWICE A DAY    .  estradiol (ESTRACE) 0.5 MG tablet TAKE 1 TABLET (0.5 MG TOTAL) BY MOUTH DAILY. 90 tablet 4  . fish oil-omega-3 fatty acids 1000 MG capsule Take 2 g by mouth daily.    Marland Kitchen GARLIC PO Take by mouth.    . hydrochlorothiazide (HYDRODIURIL) 12.5 MG tablet Take by mouth daily.    . Magnesium 250 MG TABS Take by mouth.    . vitamin E 1000 UNIT capsule Take 1,000 Units by mouth daily.     No current facility-administered medications for this visit.    LABS/IMAGING: No results found for this or any previous visit (from the past 48 hour(s)). No results found.  VITALS: BP (!) 142/82   Pulse 60   Ht 5\' 3"  (1.6 m)  Wt 224 lb 6.4 oz (101.8 kg)   BMI 39.75 kg/m   EXAM: Deferred  EKG: Deferred  ASSESSMENT: 1. High CAC score 466, 95th percentile (09/2020) 2. 7 mm right lower lobe pulmonary nodule-scheduled for follow-up chest CT in 6 months 3. Obstructive sleep apnea on CPAP 4. Hypertension 5. Dyslipidemia- ?familial hyperlipidemia 6. Palpitations 7. Morbid obesity  PLAN: 1.   [name] was found to have a very high CAC score with a significant dyslipidemia and possible familial hyperlipidemia.  She very certainly needs high potency statin therapy but was reluctant to start it due to a number of concerns.  I hope I address those today and would recommend starting the atorvastatin 80 mg daily with repeat lipids in about 3 months.  Because she wants to start an exercise program and the significant elevation in her calcium score, would recommend a Myoview stress test to further evaluate for ischemia prior to starting that.  I have encouraged continued use of her CPAP, weight loss and more physical activity as well as dietary changes.    I have also recommended starting low-dose aspirin 81 mg daily. Plan follow-up with me in 3 to 4 months with repeat lipids.  Pixie Casino, MD, [facility], [place] Director of the Advanced Lipid Disorders &  Cardiovascular Risk  Reduction Clinic Diplomate of the American Board of Clinical Lipidology Attending Cardiologist  Direct Dial: [phone]  Fax: [phone]  Website:  [email or web address]  11/03/2020, 9:46 AM

## 2020-11-03 NOTE — Patient Instructions (Signed)
Medication Instructions:  START cholesterol medication + aspirin 81mg    *If you need a refill on your cardiac medications before your next appointment, please call your pharmacy*   Lab Work: FASTING lab work in 3 months to check cholesterol   If you have labs (blood work) drawn today and your tests are completely normal, you will receive your results only by: Marland Kitchen MyChart Message (if you have MyChart) OR . A paper copy in the mail If you have any lab test that is abnormal or we need to change your treatment, we will call you to review the results.   Testing/Procedures: Dr. Debara Pickett has ordered a Myocardial Perfusion Imaging Study.   The test will take approximately 3 to 4 hours to complete; you may bring reading material.  If someone comes with you to your appointment, they will need to remain in the main lobby due to limited space in the testing area. **If you are pregnant or breastfeeding, please notify the nuclear lab prior to your appointment**  You will need to hold the following medications prior to your stress test: beta-blockers (24 hours prior to test)   How to prepare for your Myocardial Perfusion Test:  Do not eat or drink 3 hours prior to your test, except you may have water.  Do not consume products containing caffeine (regular or decaffeinated) 12 hours prior to your test. (ex: coffee, chocolate, sodas, tea).  Do wear comfortable clothes (no dresses or overalls) and walking shoes, tennis shoes preferred (No heels or open toe shoes are allowed).  Do NOT wear cologne, perfume, aftershave, or lotions (deodorant is allowed).  If these instructions are not followed, your test will have to be rescheduled.  You will need to have the coronavirus test completed prior to your procedure. An appointment has been made at ________ on  _________. This is a Drive Up Visit at the [address]. Please tell them that you are there for pre-procedure testing. Someone will  direct you to the appropriate testing line. Stay in your car and someone will be with you shortly. Please make sure to have all other labs completed before this test because you will need to stay quarantined until your procedure. Please take your insurance card to this test.     Follow-Up: At [facility], you and your health needs are our priority.  As part of our continuing mission to provide you with exceptional heart care, we have created designated Provider Care Teams.  These Care Teams include your primary Cardiologist (physician) and Advanced Practice Providers (APPs -  Physician Assistants and Nurse Practitioners) who all work together to provide you with the care you need, when you need it.  We recommend signing up for the patient portal called "MyChart".  Sign up information is provided on this After Visit Summary.  MyChart is used to connect with patients for Virtual Visits (Telemedicine).  Patients are able to view lab/test results, encounter notes, upcoming appointments, etc.  Non-urgent messages can be sent to your provider as well.   To learn more about what you can do with MyChart, go to [email or web address].    Your next appointment:   3 month(s)  The format for your next appointment:   In Person  Provider:   You may see Dr. Debara Pickett or one of the following Advanced Practice Providers on your designated Care Team:    Almyra Deforest, PA-C  Fabian Sharp, PA-C or   Roby Lofts, [place]    Other  Instructions

## 2020-11-10 ENCOUNTER — Telehealth (HOSPITAL_COMMUNITY): Payer: Self-pay | Admitting: *Deleted

## 2020-11-10 ENCOUNTER — Encounter (HOSPITAL_COMMUNITY): Payer: Self-pay | Admitting: *Deleted

## 2020-11-10 NOTE — Telephone Encounter (Signed)
Left message on voicemail in reference to upcoming appointment scheduled for  11/16/20 Phone number given for a call back so details instructions can be given. [name]

## 2020-11-12 ENCOUNTER — Other Ambulatory Visit (HOSPITAL_COMMUNITY)
Admission: RE | Admit: 2020-11-12 | Discharge: 2020-11-12 | Disposition: A | Discharge status: Home / Self Care | Payer: Medicare PPO | Source: Ambulatory Visit | Attending: Internal Medicine | Admitting: Internal Medicine

## 2020-11-12 DIAGNOSIS — Z20822 Contact with and (suspected) exposure to covid-19: Secondary | ICD-10-CM | POA: Diagnosis not present

## 2020-11-12 DIAGNOSIS — Z01812 Encounter for preprocedural laboratory examination: Secondary | ICD-10-CM | POA: Insufficient documentation

## 2020-11-12 LAB — SARS CORONAVIRUS 2 (TAT 6-24 HRS): SARS Coronavirus 2: NEGATIVE

## 2020-11-16 ENCOUNTER — Ambulatory Visit: Payer: Medicare PPO | Admitting: Internal Medicine

## 2020-11-16 ENCOUNTER — Other Ambulatory Visit: Payer: Self-pay

## 2020-11-16 ENCOUNTER — Ambulatory Visit (HOSPITAL_COMMUNITY): Payer: Medicare PPO | Attending: Cardiovascular Disease

## 2020-11-16 ENCOUNTER — Ambulatory Visit (HOSPITAL_COMMUNITY): Payer: Medicare PPO

## 2020-11-16 DIAGNOSIS — R0602 Shortness of breath: Secondary | ICD-10-CM | POA: Diagnosis not present

## 2020-11-16 DIAGNOSIS — I2510 Atherosclerotic heart disease of native coronary artery without angina pectoris: Secondary | ICD-10-CM | POA: Diagnosis not present

## 2020-11-16 DIAGNOSIS — E782 Mixed hyperlipidemia: Secondary | ICD-10-CM | POA: Diagnosis not present

## 2020-11-16 DIAGNOSIS — I2584 Coronary atherosclerosis due to calcified coronary lesion: Secondary | ICD-10-CM | POA: Insufficient documentation

## 2020-11-16 MED ORDER — REGADENOSON 0.4 MG/5ML IV SOLN
0.4000 mg | Freq: Once | INTRAVENOUS | Status: DC
Start: 2020-11-16 — End: 2023-07-03

## 2020-11-16 MED ORDER — TECHNETIUM TC 99M TETROFOSMIN IV KIT
31.3000 | PACK | Freq: Once | INTRAVENOUS | Status: AC | PRN
  Administered 2020-11-16: 31.3 via INTRAVENOUS
  Filled 2020-11-16: qty 32

## 2020-11-17 ENCOUNTER — Ambulatory Visit (HOSPITAL_COMMUNITY): Payer: Medicare PPO | Attending: Cardiology

## 2020-11-17 ENCOUNTER — Ambulatory Visit (HOSPITAL_COMMUNITY): Payer: Medicare PPO

## 2020-11-17 LAB — MYOCARDIAL PERFUSION IMAGING
Estimated workload: 5.3 METS
Exercise duration (min): 4 min
Exercise duration (sec): 15 s
LV dias vol: 71 mL (ref 46–106)
LV sys vol: 30 mL
MPHR: 152 {beats}/min
Peak HR: 131 {beats}/min
Percent HR: 86 %
RPE: 20
Rest HR: 75 {beats}/min
SDS: 0
SRS: 0
SSS: 0
TID: 1.08

## 2020-11-17 MED ORDER — TECHNETIUM TC 99M TETROFOSMIN IV KIT
31.6000 | PACK | Freq: Once | INTRAVENOUS | Status: AC | PRN
  Administered 2020-11-17: 31.6 via INTRAVENOUS
  Filled 2020-11-17: qty 32

## 2021-02-03 ENCOUNTER — Ambulatory Visit: Payer: Medicare PPO | Admitting: Internal Medicine

## 2021-03-30 ENCOUNTER — Telehealth: Payer: Self-pay | Admitting: Internal Medicine

## 2021-03-30 NOTE — Telephone Encounter (Signed)
Left message for patient to call and discuss scheduling the CT chest nodule follow up ordered by Dr. Hilty 

## 2021-04-03 NOTE — Telephone Encounter (Signed)
Left message for patient to call and discuss scheduling the CT chest nodule follow up ordered by Dr. Hilty 

## 2021-04-05 NOTE — Telephone Encounter (Signed)
Left message for patient to call and discuss scheduling the CT chest nodule follow up ordered by Dr. Debara Pickett

## 2021-04-09 IMAGING — CT CT CARDIAC CORONARY ARTERY CALCIUM SCORE
3 series · 14 of 20 positions shown, 15 images · non-contrast
Comparison: None.
COMPARISON: None.

Addendum:
EXAM:
OVER-READ INTERPRETATION  CT CHEST

The following report is an over-read performed by radiologist Dr.
Shaw Schulz [REDACTED] on 10/19/2020. This
over-read does not include interpretation of cardiac or coronary
anatomy or pathology. The coronary calcium score interpretation by
the cardiologist is attached.
CLINICAL DATA: Risk stratification
Coronary Calcium Score
TECHNIQUE: The patient was scanned on a Siemens Force scanner. Axial
non-contrast 3 mm slices were carried out through the heart. The
data set was analyzed on a dedicated work station and scored using
the Agatson method.

[Series 2: casc 3.0 bv41 2 bestdiast 70 % · axial · 0.35mm/px · z∈[-195,-114]mm · 4 of 45 slices shown, 5 images]
[im 9/45  vessel]
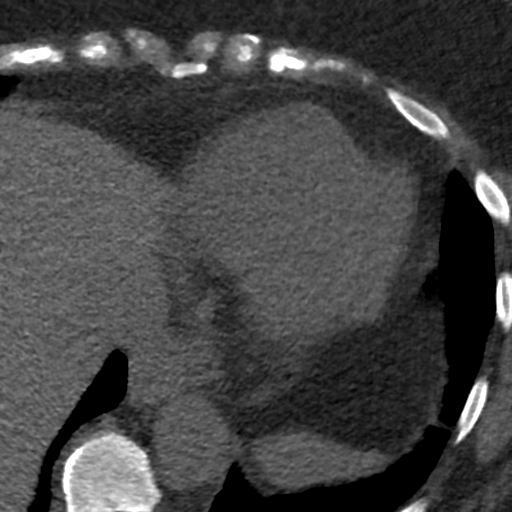
[im 9/45  lung]
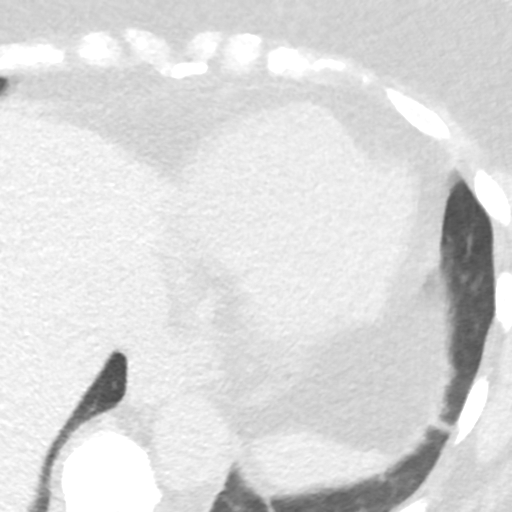
[im 18/45  vessel]
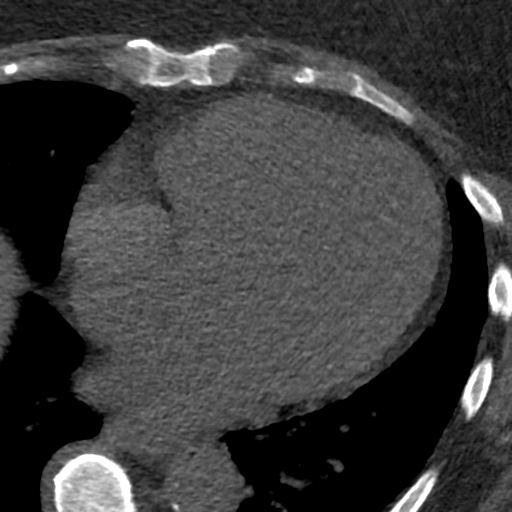
[im 27/45  vessel]
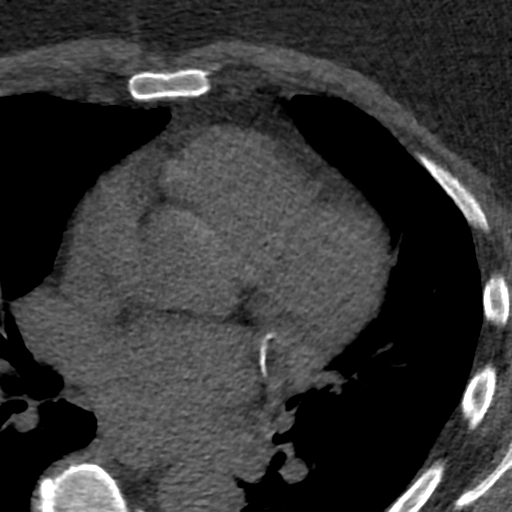
[im 36/45  vessel]
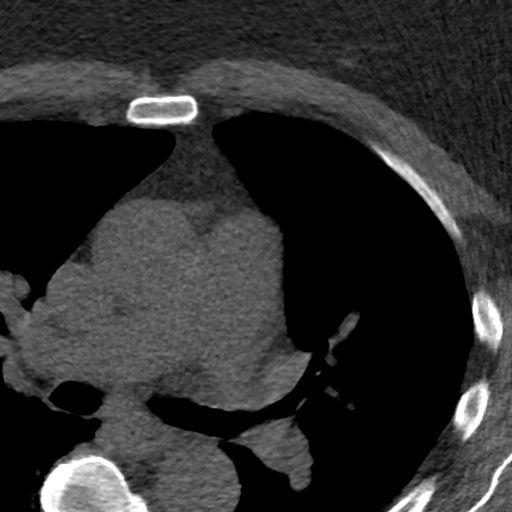

[Series 3: lung 71 % · axial · 0.63mm/px · z∈[-198,-111]mm · 5 of 45 slices shown]
[im 8/45  lung]
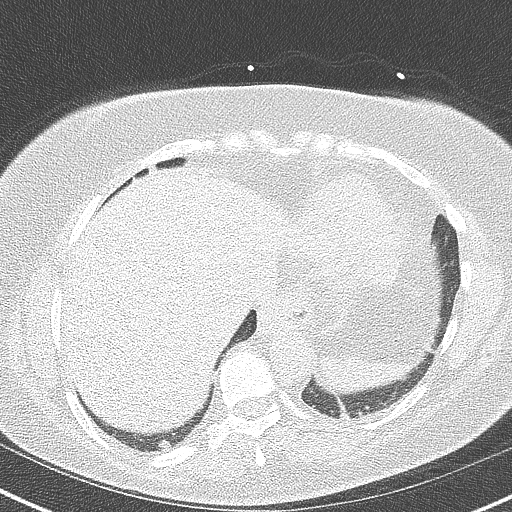
[im 15/45  lung]
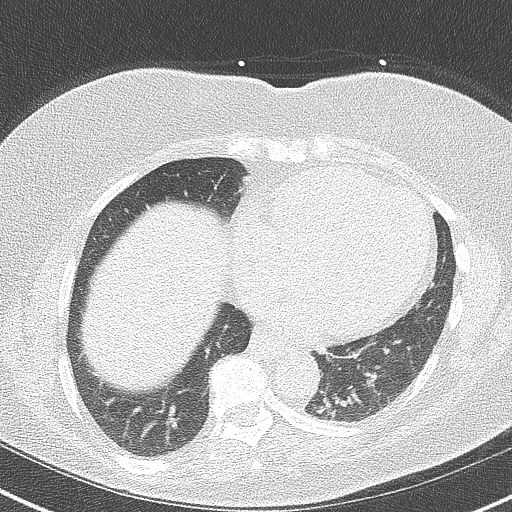
[im 23/45  lung]
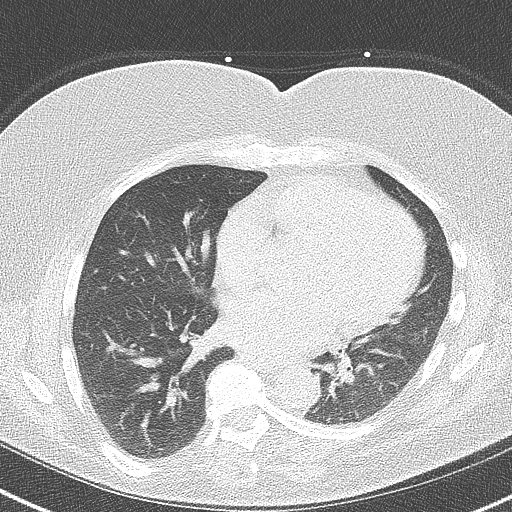
[im 30/45  lung]
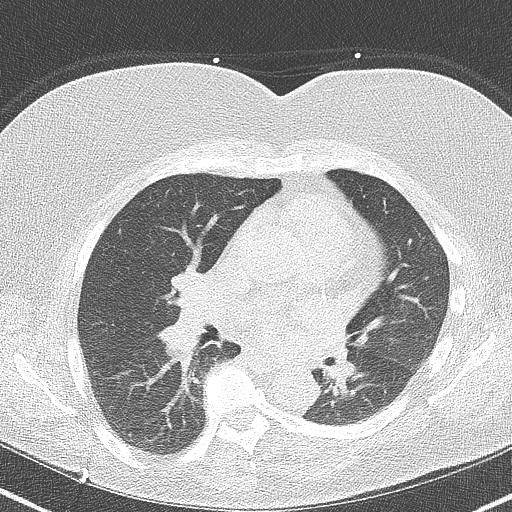
[im 37/45  lung]
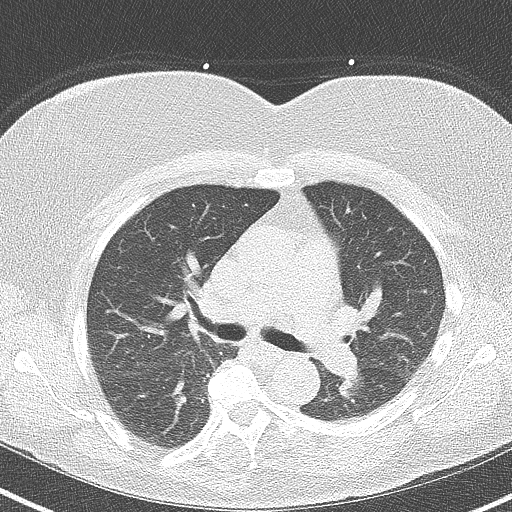

[Series 4: lung st 71 % · axial · 0.62mm/px · z∈[-198,-111]mm · 5 of 45 slices shown]
[im 8/45  lung]
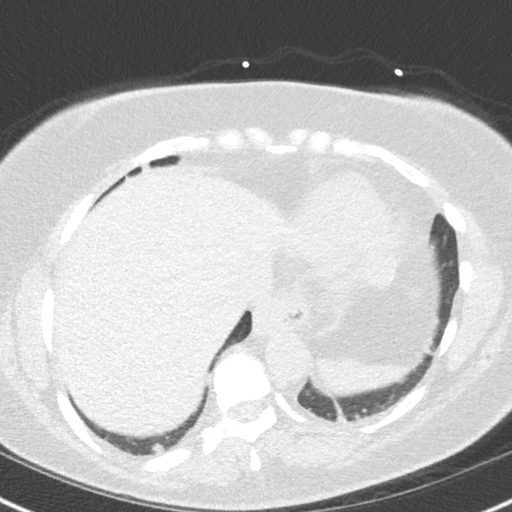
[im 15/45  lung]
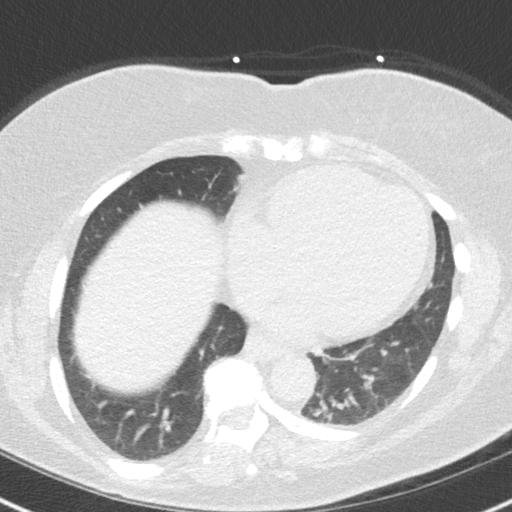
[im 23/45  lung]
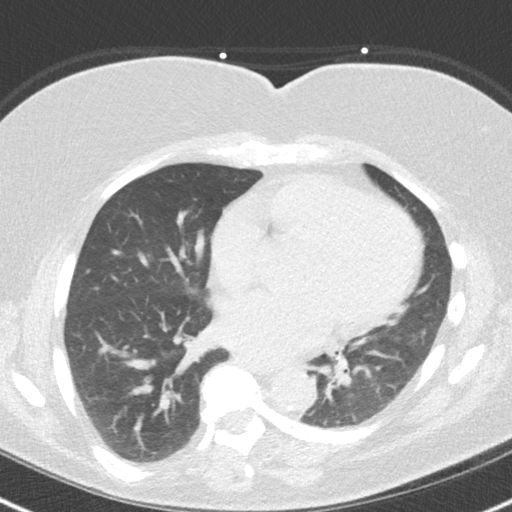
[im 30/45  lung]
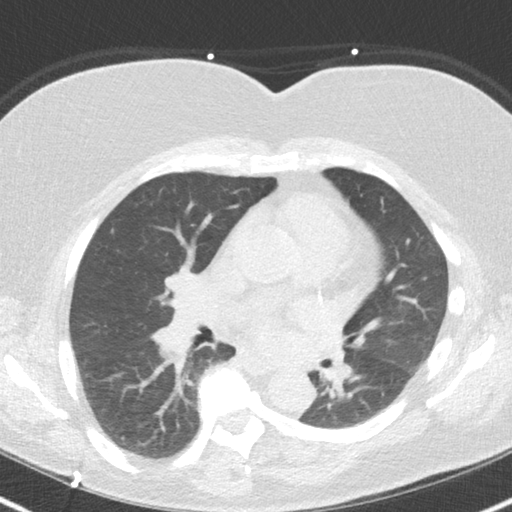
[im 37/45  lung]
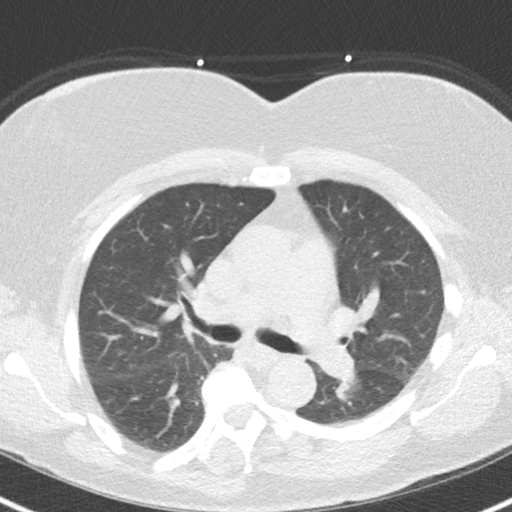

[14 of 20 positions shown; findings below may reference images not displayed]

FINDINGS: Vascular: Heart is normal size. Aorta normal caliber. Scattered
calcifications in the aortic arch and descending thoracic aorta.

Mediastinum/Nodes: No adenopathy

Lungs/Pleura: Linear scarring at the left base. Small nodule at the
right lung base measures 7 mm on image 38. No effusions.

Upper Abdomen: Imaging into the upper abdomen demonstrates no acute
findings.

Musculoskeletal: Chest wall soft tissues are unremarkable. No acute
bony abnormality.
IMPRESSION: 7 mm right lower lobe pulmonary nodule. Non-contrast chest CT at
6-12 months is recommended. If the nodule is stable at time of
repeat CT, then future CT at 18-24 months (from today's scan) is
considered optional for low-risk patients, but is recommended for
high-risk patients. This recommendation follows the consensus
statement: Guidelines for Management of Incidental Pulmonary Nodules
Detected on CT Images: From the [HOSPITAL] 3424; Radiology

Scattered aortic atherosclerosis.
FINDINGS: Non-cardiac: See separate report from [REDACTED].

Ascending Aorta: Normal caliber.  Aortic atherosclerosis.

Pericardium: Normal

Coronary arteries: Normal origins. Calcium noted in the LM, proximal
LAD and LCx arteries.
IMPRESSION: Coronary calcium score of 466. This was 95th percentile for age and
sex matched control.

Aggressive risk factor modification is recommended.

*** End of Addendum ***
EXAM:
OVER-READ INTERPRETATION  CT CHEST

The following report is an over-read performed by radiologist Dr.
Shaw Schulz [REDACTED] on 10/19/2020. This
over-read does not include interpretation of cardiac or coronary
anatomy or pathology. The coronary calcium score interpretation by
the cardiologist is attached.
FINDINGS: Vascular: Heart is normal size. Aorta normal caliber. Scattered
calcifications in the aortic arch and descending thoracic aorta.

Mediastinum/Nodes: No adenopathy

Lungs/Pleura: Linear scarring at the left base. Small nodule at the
right lung base measures 7 mm on image 38. No effusions.

Upper Abdomen: Imaging into the upper abdomen demonstrates no acute
findings.

Musculoskeletal: Chest wall soft tissues are unremarkable. No acute
bony abnormality.
IMPRESSION: 7 mm right lower lobe pulmonary nodule. Non-contrast chest CT at
6-12 months is recommended. If the nodule is stable at time of
repeat CT, then future CT at 18-24 months (from today's scan) is
considered optional for low-risk patients, but is recommended for
high-risk patients. This recommendation follows the consensus
statement: Guidelines for Management of Incidental Pulmonary Nodules
Detected on CT Images: From the [HOSPITAL] 3424; Radiology

Scattered aortic atherosclerosis.

## 2021-04-19 ENCOUNTER — Emergency Department (HOSPITAL_BASED_OUTPATIENT_CLINIC_OR_DEPARTMENT_OTHER): Payer: Medicare PPO | Admitting: Radiology

## 2021-04-19 ENCOUNTER — Encounter (HOSPITAL_BASED_OUTPATIENT_CLINIC_OR_DEPARTMENT_OTHER): Payer: Self-pay

## 2021-04-19 ENCOUNTER — Other Ambulatory Visit: Payer: Self-pay

## 2021-04-19 ENCOUNTER — Emergency Department (HOSPITAL_BASED_OUTPATIENT_CLINIC_OR_DEPARTMENT_OTHER)
Admission: EM | Admit: 2021-04-19 | Discharge: 2021-04-19 | Disposition: A | Discharge status: Home / Self Care | Payer: Medicare PPO | Attending: Emergency Medicine | Admitting: Emergency Medicine

## 2021-04-19 DIAGNOSIS — Z96651 Presence of right artificial knee joint: Secondary | ICD-10-CM | POA: Insufficient documentation

## 2021-04-19 DIAGNOSIS — R079 Chest pain, unspecified: Secondary | ICD-10-CM | POA: Diagnosis present

## 2021-04-19 DIAGNOSIS — Z87891 Personal history of nicotine dependence: Secondary | ICD-10-CM | POA: Insufficient documentation

## 2021-04-19 DIAGNOSIS — I10 Essential (primary) hypertension: Secondary | ICD-10-CM | POA: Diagnosis not present

## 2021-04-19 DIAGNOSIS — Z79899 Other long term (current) drug therapy: Secondary | ICD-10-CM | POA: Insufficient documentation

## 2021-04-19 LAB — CBC
HCT: 40.1 % (ref 36.0–46.0)
Hemoglobin: 12.8 g/dL (ref 12.0–15.0)
MCH: 28.5 pg (ref 26.0–34.0)
MCHC: 31.9 g/dL (ref 30.0–36.0)
MCV: 89.3 fL (ref 80.0–100.0)
Platelets: 176 10*3/uL (ref 150–400)
RBC: 4.49 MIL/uL (ref 3.87–5.11)
RDW: 11.9 % (ref 11.5–15.5)
WBC: 5.5 10*3/uL (ref 4.0–10.5)
nRBC: 0 % (ref 0.0–0.2)

## 2021-04-19 LAB — BASIC METABOLIC PANEL
Anion gap: 7 (ref 5–15)
BUN: 12 mg/dL (ref 8–23)
CO2: 30 mmol/L (ref 22–32)
Calcium: 9.5 mg/dL (ref 8.9–10.3)
Chloride: 102 mmol/L (ref 98–111)
Creatinine, Ser: 1.03 mg/dL — ABNORMAL HIGH (ref 0.44–1.00)
GFR, Estimated: 59 mL/min — ABNORMAL LOW (ref >60)
Glucose, Bld: 102 mg/dL — ABNORMAL HIGH (ref 70–99)
Potassium: 3.8 mmol/L (ref 3.5–5.1)
Sodium: 139 mmol/L (ref 135–145)

## 2021-04-19 LAB — TROPONIN I (HIGH SENSITIVITY)
Troponin I (High Sensitivity): 3 ng/L (ref <18)
Troponin I (High Sensitivity): 3 ng/L (ref <18)

## 2021-04-19 MED ORDER — NITROGLYCERIN 0.4 MG SL SUBL: 0.4000 mg | SUBLINGUAL_TABLET | SUBLINGUAL | 0 refills | Status: DC | PRN

## 2021-04-19 NOTE — ED Provider Notes (Signed)
[facility] EMERGENCY DEPT Provider Note   CSN: [id number] Arrival date & time: 04/19/21  1129     History CC:  Chest pain  [name] is a 70 y.o. female history of hypertension, hyperlipidemia, heart murmur, reflux, presenting to emergency department with chest pain.  She works as a Investment banker, operational.  She reports that around 10 AM while at work, she began having sharp pains across her left side of her chest.  These lasted a few minutes and have since resolved completely.  She said they are similar to pain she has had in the past.  She denies lightheadedness, nausea or vomiting of this.  She was concerned about the intensity of the pain and came into the ED.  She is currently asymptomatic.  She denies any cough, congestion, fevers, chills, difficulty breathing  She reports that she normally has high blood pressure.  She denies history of diabetes.  She is unsure about her history of high cholesterol, although this is documented by her PCP note.  She denies smoking history.  She denies significant family history of MI.   Cardiologist is Dr Lyman Bishop [facility] on 10/19/20 with calcium score of 466 (95% percentail for age) Stress test on 11/17/20 with normal perfusion, per dr Lysbeth Penner note, "consider an electrically false positive stress test."  HPI     Past Medical History:  Diagnosis Date  . Arthritis   . Blood transfusion without reported diagnosis   . Cataract   . Diverticulitis   . Fibroid   . GERD (gastroesophageal reflux disease)    occ  . Glaucoma   . Heart murmur   . History of echocardiogram 10/16/2011   EF >55%; borderline concentric LVH; mild MR, mild TR  . History of palpitations   . Hyperlipidemia   . Hypertension   . OSA on CPAP     Patient Active Problem List   Diagnosis Date Noted  . Severe sleep apnea 04/21/2019  . Morbid obesity with body mass index of 40.0-44.9 in adult [facility]) 02/10/2019  . HTN (hypertension), benign 02/10/2019  .  OSA on CPAP 06/22/2013  . Overweight 05/05/2013  . Degenerative joint disease 05/05/2013  . History of kidney stones 05/05/2013  . Glaucoma     Past Surgical History:  Procedure Laterality Date  . ABDOMINAL HYSTERECTOMY  1991   TAH,BSO  . BREAST SURGERY     REDUCTION MAMMAPLASTY  . EYE SURGERY  2010  . EYE SURGERY     shunt due to glaucoma  . HERNIA REPAIR    . INSERTION OF AHMED VALVE Left 05/04/2015   Procedure: REVISION AHMED VALVE LEFT EYE WITH TUTOPLAST AND AMNIOGRAFT;  Surgeon: Marylynn Pearson, MD;  Location: [facility];  Service: Ophthalmology;  Laterality: Left;  . JOINT REPLACEMENT    . OOPHORECTOMY     BSO  . REPLACEMENT TOTAL KNEE Right 2008  . TONSILLECTOMY       OB History    Gravida  0   Para      Term      Preterm      AB      Living        SAB      IAB      Ectopic      Multiple      Live Births              Family History  Problem Relation Age of Onset  . Cancer Mother  Bladder cancer  . Hypertension Brother   . COPD Brother   . Hypertension Sister   . Breast cancer Sister        Age 42  . Cancer Sister        Bladder cancer  . Stroke Maternal Grandmother   . Colon cancer Neg Hx     Social History   Tobacco Use  . Smoking status: Former Smoker    Quit date: 11/26/1980    Years since quitting: 40.4  . Smokeless tobacco: Never Used  Vaping Use  . Vaping Use: Never used  Substance Use Topics  . Alcohol use: No    Alcohol/week: 0.0 standard drinks  . Drug use: No    Home Medications Prior to Admission medications   Medication Sig Start Date End Date Taking? Authorizing Provider  nitroGLYCERIN (NITROSTAT) 0.4 MG SL tablet Place 1 tablet (0.4 mg total) under the tongue every 5 (five) minutes as needed for up to 30 doses for chest pain. Up to 2 doses in a row 04/19/21  Yes , Carola Rhine, MD  acetaminophen (TYLENOL) 500 MG tablet Take 500 mg by mouth every 6 (six) hours as needed for mild pain or moderate pain.     [provider]  atorvastatin (LIPITOR) 80 MG tablet Take 1 tablet (80 mg total) by mouth daily. 10/19/20 01/17/21  Hilty, Nadean Corwin, MD  Brimonidine Tartrate-Timolol (COMBIGAN OP) Apply to eye.    [provider]  cholecalciferol (VITAMIN D) 1000 UNITS tablet Take 2,000 Units by mouth daily.     [provider]  dorzolamide-timolol (COSOPT) 22.3-6.8 MG/ML ophthalmic solution INSTILL 1 DROP INTO RIGHT EYE TWICE A DAY 07/07/19   [provider]  estradiol (ESTRACE) 0.5 MG tablet TAKE 1 TABLET (0.5 MG TOTAL) BY MOUTH DAILY. 05/12/18   Fontaine, Belinda Block, MD  fish oil-omega-3 fatty acids 1000 MG capsule Take 2 g by mouth daily.    [provider]  GARLIC PO Take by mouth.    [provider]  hydrochlorothiazide (HYDRODIURIL) 12.5 MG tablet Take by mouth daily.    [provider]  Magnesium 250 MG TABS Take by mouth.    [provider]  vitamin E 1000 UNIT capsule Take 1,000 Units by mouth daily.    [provider]    Allergies    Aspirin and Sulfa antibiotics  Review of Systems   Review of Systems  Constitutional: Negative for chills and fever.  HENT: Negative for ear pain and sore throat.   Eyes: Negative for pain and visual disturbance.  Respiratory: Negative for cough and shortness of breath.   Cardiovascular: Positive for chest pain. Negative for palpitations.  Gastrointestinal: Negative for abdominal pain and vomiting.  Genitourinary: Negative for dysuria and hematuria.  Musculoskeletal: Negative for arthralgias and back pain.  Skin: Negative for color change and rash.  Neurological: Negative for syncope and headaches.  All other systems reviewed and are negative.   Physical Exam Updated Vital Signs BP (!) 147/67 (BP Location: Left Arm)   Pulse 60   Temp 98 F (36.7 C)   Resp 15   Ht 5\' 3"  (1.6 m)   Wt 102.1 kg   SpO2 100%   BMI 39.86 kg/m   Physical Exam Constitutional:      General: She is  not in acute distress. HENT:     Head: Normocephalic and atraumatic.  Eyes:     Conjunctiva/sclera: Conjunctivae normal.     Pupils: Pupils are equal, round,  and reactive to light.  Cardiovascular:     Rate and Rhythm: Normal rate and regular rhythm.     Pulses: Normal pulses.  Pulmonary:     Effort: Pulmonary effort is normal. No respiratory distress.  Abdominal:     General: There is no distension.     Tenderness: There is no abdominal tenderness.  Skin:    General: Skin is warm and dry.  Neurological:     General: No focal deficit present.     Mental Status: She is alert. Mental status is at baseline.  Psychiatric:        Mood and Affect: Mood normal.        Behavior: Behavior normal.     ED Results / Procedures / Treatments   Labs (all labs ordered are listed, but only abnormal results are displayed) Labs Reviewed  BASIC METABOLIC PANEL - Abnormal; Notable for the following components:      Result Value   Glucose, Bld 102 (*)    Creatinine, Ser 1.03 (*)    GFR, Estimated 59 (*)    All other components within normal limits  CBC  TROPONIN I (HIGH SENSITIVITY)  TROPONIN I (HIGH SENSITIVITY)    EKG EKG Interpretation  Date/Time:  Wednesday Apr 19 2021 11:37:22 EDT Ventricular Rate:  77 PR Interval:  222 QRS Duration: 95 QT Interval:  353 QTC Calculation: 400 R Axis:   -31 Text Interpretation: Sinus rhythm Prolonged PR interval Left axis deviation Abnormal R-wave progression, late transition Confirmed by Octaviano Glow [phone]) on 04/19/2021 12:28:24 PM   Radiology DG Chest 2 View  Result Date: 04/19/2021 CLINICAL DATA:  Chest pain. EXAM: CHEST - 2 VIEW COMPARISON:  September 25, 2013. FINDINGS: Similar enlargement the cardiac silhouette. Aortic atherosclerosis. Both lungs are clear. No visible pleural effusions or pneumothorax. No acute osseous abnormality. S-shaped thoracolumbar curvature. IMPRESSION: 1. No acute cardiopulmonary disease. 2. Cardiomegaly.  Electronically Signed   By: Margaretha Sheffield MD   On: 04/19/2021 12:55    Procedures Procedures   Medications Ordered in ED Medications - No data to display  ED Course  I have reviewed the triage vital signs and the nursing notes.  Pertinent labs & imaging results that were available during my care of the patient were reviewed by me and considered in my medical decision making (see chart for details).  This patient presents to the Emergency Department with complaint of chest pain. This involves an extensive number of treatment options, and is a complaint that carries with it a high risk of complications and morbidity.  The differential diagnosis includes ACS vs Pneumothorax vs Reflux/Gastritis vs MSK pain vs Pneumonia vs other.  I felt PE was less likely given that she has had these symptoms in the past, and that these symptoms were transient, and that she has no respiratory complaints.  She is completely asymptomatic in the ED.  No active chest pain.  I ordered, reviewed, and interpreted labs, including BMP and CBC.  There were no immediate, life-threatening emergencies found in this labwork.  The patient's troponin level was 3 -> 3. I ordered imaging studies which included x-ray of the chest I independently visualized and interpreted imaging which showed no evident PNA or PTX, some mild cardiomegaly, no significant pulm edema, and the monitor tracing which showed NSR Previous records obtained and reviewed showing outpatient cardiac work-up over the past year via Dr Debara Pickett I personally reviewed the patients ECG which showed sinus rhythm with no acute ischemic findings  After  the interventions stated above, I reevaluated the patient and found that they remained clinically stable.  Based on the patient's clinical exam, vital signs, risk factors, and ED testing, I felt that the patient's overall risk of life-threatening emergency such as ACS, PE, sepsis, or infection was low.  At this time,  I felt the patient's presentation was most clinically consistent with gastritis vs muscle spasm, but explained to the patient that this evaluation was not a definitive diagnostic workup.  I discussed outpatient follow up with primary care provider, and provided specialist office number on the patient's discharge paper if a referral was deemed necessary.  Return precautions were discussed with the patient.  I felt the patient was clinically stable for discharge.   Clinical Course as of 04/19/21 1718  Wed Apr 19, 2021  1416 She remains pain-free at this time.   Still unclear to me at this time whether this is truly angina, but we did discuss a prescription for nitroglycerin, to use as needed at home.  I also discussed the absolute importance of contacting her cardiologist tomorrow to arrange follow-up [MT]    Clinical Course User Index [MT] , Carola Rhine, MD    Final Clinical Impression(s) / ED Diagnoses Final diagnoses:  Chest pain, unspecified type    Rx / DC Orders ED Discharge Orders         Ordered    nitroGLYCERIN (NITROSTAT) 0.4 MG SL tablet  Every 5 min PRN        04/19/21 1418           Wyvonnia Dusky, MD 04/19/21 1719

## 2021-04-19 NOTE — ED Notes (Signed)
This RN attempted to stick patient x2 without success. RN Elba Barman attempting at this time.

## 2021-04-19 NOTE — ED Notes (Signed)
2 unsuccessful IV attempts.

## 2021-04-19 NOTE — Discharge Instructions (Signed)
You should call your cardiologist office tomorrow to discuss your visit to the ER for chest pain.  You should ask to arrange for follow-up appointment with the cardiologist.  We talked about nitroglycerin medicine at home for chest pain.  This may or may not be useful for your pain.  If you have another episode of chest pain, stop what you are doing and sit down and attempt to rest.  If your pain does not pass over the next minute, you can take 1 nitroglycerin tablet under the tongue.  This medicine should work within 3 to 5 minutes.  You may feel lightheaded rash, some wooziness, and a headache is common side effects.   If the medicine does not relieve your chest pain, call 911 and you can take another nitroglycerin tablet after 15 minutes.

## 2021-04-19 NOTE — ED Triage Notes (Signed)
Pt came in c/o of CP that started around 1030 this morning. Pt describes this pain as sharp. Pt denies any radiation of pain. Pt sees Dr Lenise Herald for cardiologist for a physical but denies any heart related medical issues.

## 2021-06-02 DIAGNOSIS — Z803 Family history of malignant neoplasm of breast: Secondary | ICD-10-CM | POA: Diagnosis not present

## 2021-06-02 DIAGNOSIS — Z6839 Body mass index (BMI) 39.0-39.9, adult: Secondary | ICD-10-CM | POA: Diagnosis not present

## 2021-06-02 DIAGNOSIS — Z833 Family history of diabetes mellitus: Secondary | ICD-10-CM | POA: Diagnosis not present

## 2021-06-02 DIAGNOSIS — I10 Essential (primary) hypertension: Secondary | ICD-10-CM | POA: Diagnosis not present

## 2021-06-02 DIAGNOSIS — H409 Unspecified glaucoma: Secondary | ICD-10-CM | POA: Diagnosis not present

## 2021-06-02 DIAGNOSIS — Z825 Family history of asthma and other chronic lower respiratory diseases: Secondary | ICD-10-CM | POA: Diagnosis not present

## 2021-06-02 DIAGNOSIS — Z79890 Hormone replacement therapy (postmenopausal): Secondary | ICD-10-CM | POA: Diagnosis not present

## 2021-06-02 DIAGNOSIS — Z8249 Family history of ischemic heart disease and other diseases of the circulatory system: Secondary | ICD-10-CM | POA: Diagnosis not present

## 2021-06-02 DIAGNOSIS — E669 Obesity, unspecified: Secondary | ICD-10-CM | POA: Diagnosis not present

## 2021-06-03 DIAGNOSIS — Z1231 Encounter for screening mammogram for malignant neoplasm of breast: Secondary | ICD-10-CM | POA: Diagnosis not present

## 2021-06-03 DIAGNOSIS — Z803 Family history of malignant neoplasm of breast: Secondary | ICD-10-CM | POA: Diagnosis not present

## 2021-06-15 DIAGNOSIS — R928 Other abnormal and inconclusive findings on diagnostic imaging of breast: Secondary | ICD-10-CM | POA: Diagnosis not present

## 2021-06-15 DIAGNOSIS — R922 Inconclusive mammogram: Secondary | ICD-10-CM | POA: Diagnosis not present

## 2021-07-03 NOTE — Progress Notes (Deleted)
   PATIENT: [name] DOB: 07-Nov-1951  REASON FOR VISIT: follow up HISTORY FROM: patient  Virtual Visit via Telephone Note  I connected with Lendon Colonel on 07/03/21 at  8:45 AM EDT by telephone and verified that I am speaking with the correct person using two identifiers.   I discussed the limitations, risks, security and privacy concerns of performing an evaluation and management service by telephone and the availability of in person appointments. I also discussed with the patient that there may be a patient responsible charge related to this service. The patient expressed understanding and agreed to proceed.   History of Present Illness:  07/03/21 ALL Mychart: [name] is a 71 y.o. female here today for follow up for OSA on CPAP therapy.      07/25/20 ALL:  [name] is a 70 y.o. female here today for follow up for OSA on CPAP. She is doing very well. She reports that she uses her machine every night. She was caring for her aunt for 2 weeks and was not able to use her machine due to getting up and down all night. Otherwise, she has been exceptionally compliant. She does feel better rested and more energized on therapy. She is working part time in [employer] at an [employer].    Compliance report dated 06/24/2020 through 07/23/2020 reveals that she used CPAP 23 of the past 30 days for compliance of 77%.  She used CPAP greater than 4 hours all 23 of the past 30 days, again for compliance of 77%.  Average usage on days used was 8 hours and 38 minutes.  Residual AHI was 0.6 on 6 to 16 cm of water pressure and an EPR of 3.  There was no significant leak noted.  Observations/Objective:  Generalized: Well developed, in no acute distress  Mentation: Alert oriented to time, place, history taking. Follows all commands speech and language fluent   Assessment and Plan:  70 y.o. year old female  has a past medical history of Arthritis, Blood transfusion  without reported diagnosis, Cataract, Diverticulitis, Fibroid, GERD (gastroesophageal reflux disease), Glaucoma, Heart murmur, History of echocardiogram (10/16/2011), History of palpitations, Hyperlipidemia, Hypertension, and OSA on CPAP. here with    ICD-10-CM   1. OSA on CPAP  G47.33    Z99.89       Jolyn continues to do very well on CPAP therapy. Compliance report reveals excellent compliance. She was encouraged to continue CPAP nightly for at least 4 hours. We will update supply orders. She will continue to work on healthy lifestyle habits. She will follow up with me in 1 year, sooner if needed.   No orders of the defined types were placed in this encounter.   No orders of the defined types were placed in this encounter.    Follow Up Instructions:  I discussed the assessment and treatment plan with the patient. The patient was provided an opportunity to ask questions and all were answered. The patient agreed with the plan and demonstrated an understanding of the instructions.   The patient was advised to call back or seek an in-person evaluation if the symptoms worsen or if the condition fails to improve as anticipated.  I provided 15 minutes of non-face-to-face time during this encounter. Patient located at their place of residence during [place] visit. Provider is in the office.    Debbora Presto, NP

## 2021-07-04 ENCOUNTER — Telehealth: Payer: Medicare PPO | Admitting: Family Medicine

## 2021-07-04 DIAGNOSIS — G4733 Obstructive sleep apnea (adult) (pediatric): Secondary | ICD-10-CM

## 2021-07-28 DIAGNOSIS — G4733 Obstructive sleep apnea (adult) (pediatric): Secondary | ICD-10-CM | POA: Diagnosis not present

## 2021-09-19 DIAGNOSIS — H401133 Primary open-angle glaucoma, bilateral, severe stage: Secondary | ICD-10-CM | POA: Diagnosis not present

## 2021-10-08 IMAGING — DX DG CHEST 2V
2 series · 2 of 2 positions shown · non-contrast
Comparison: September 25, 2013.

CLINICAL DATA: Chest pain.

EXAM:
CHEST - 2 VIEW

[chest pa]
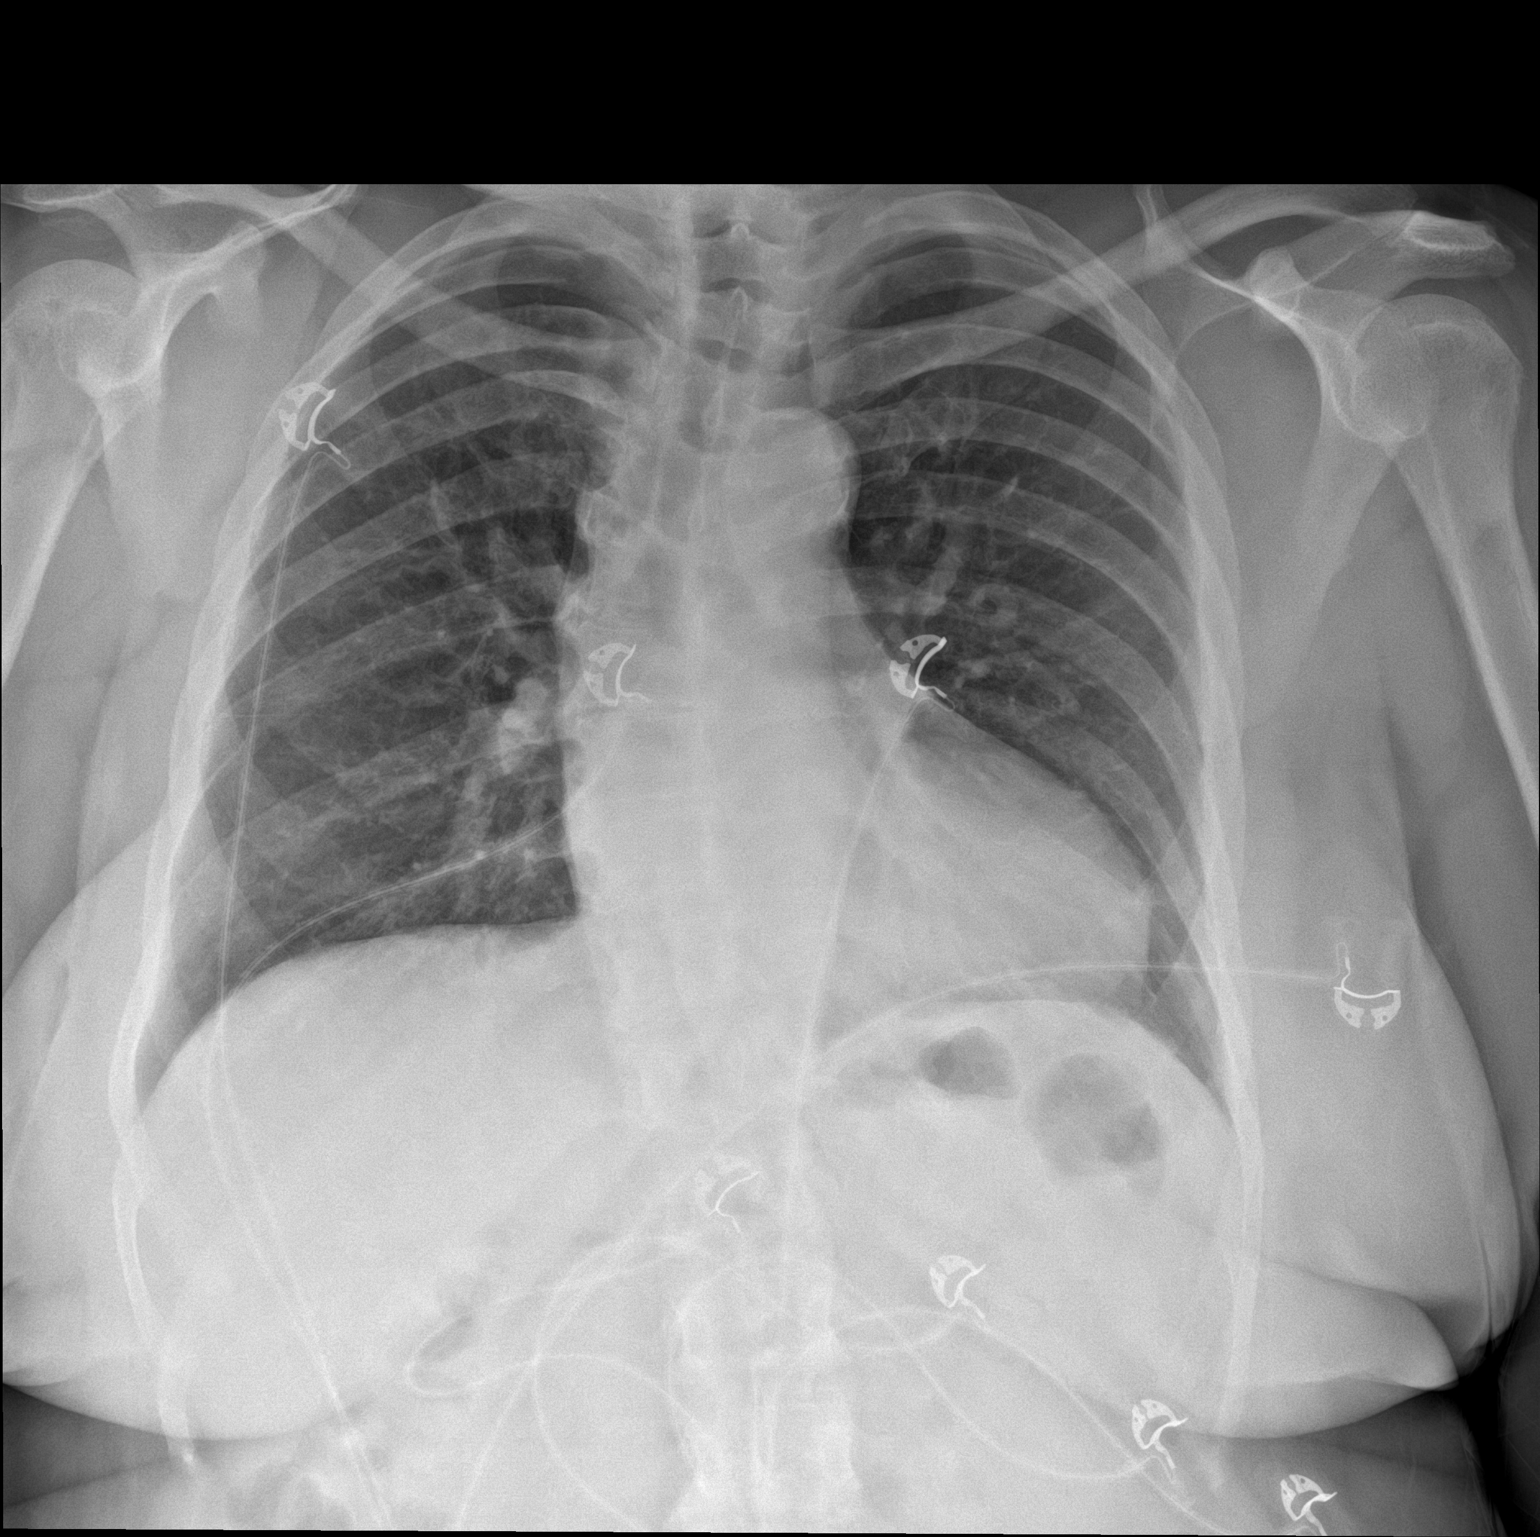

[chest lat]
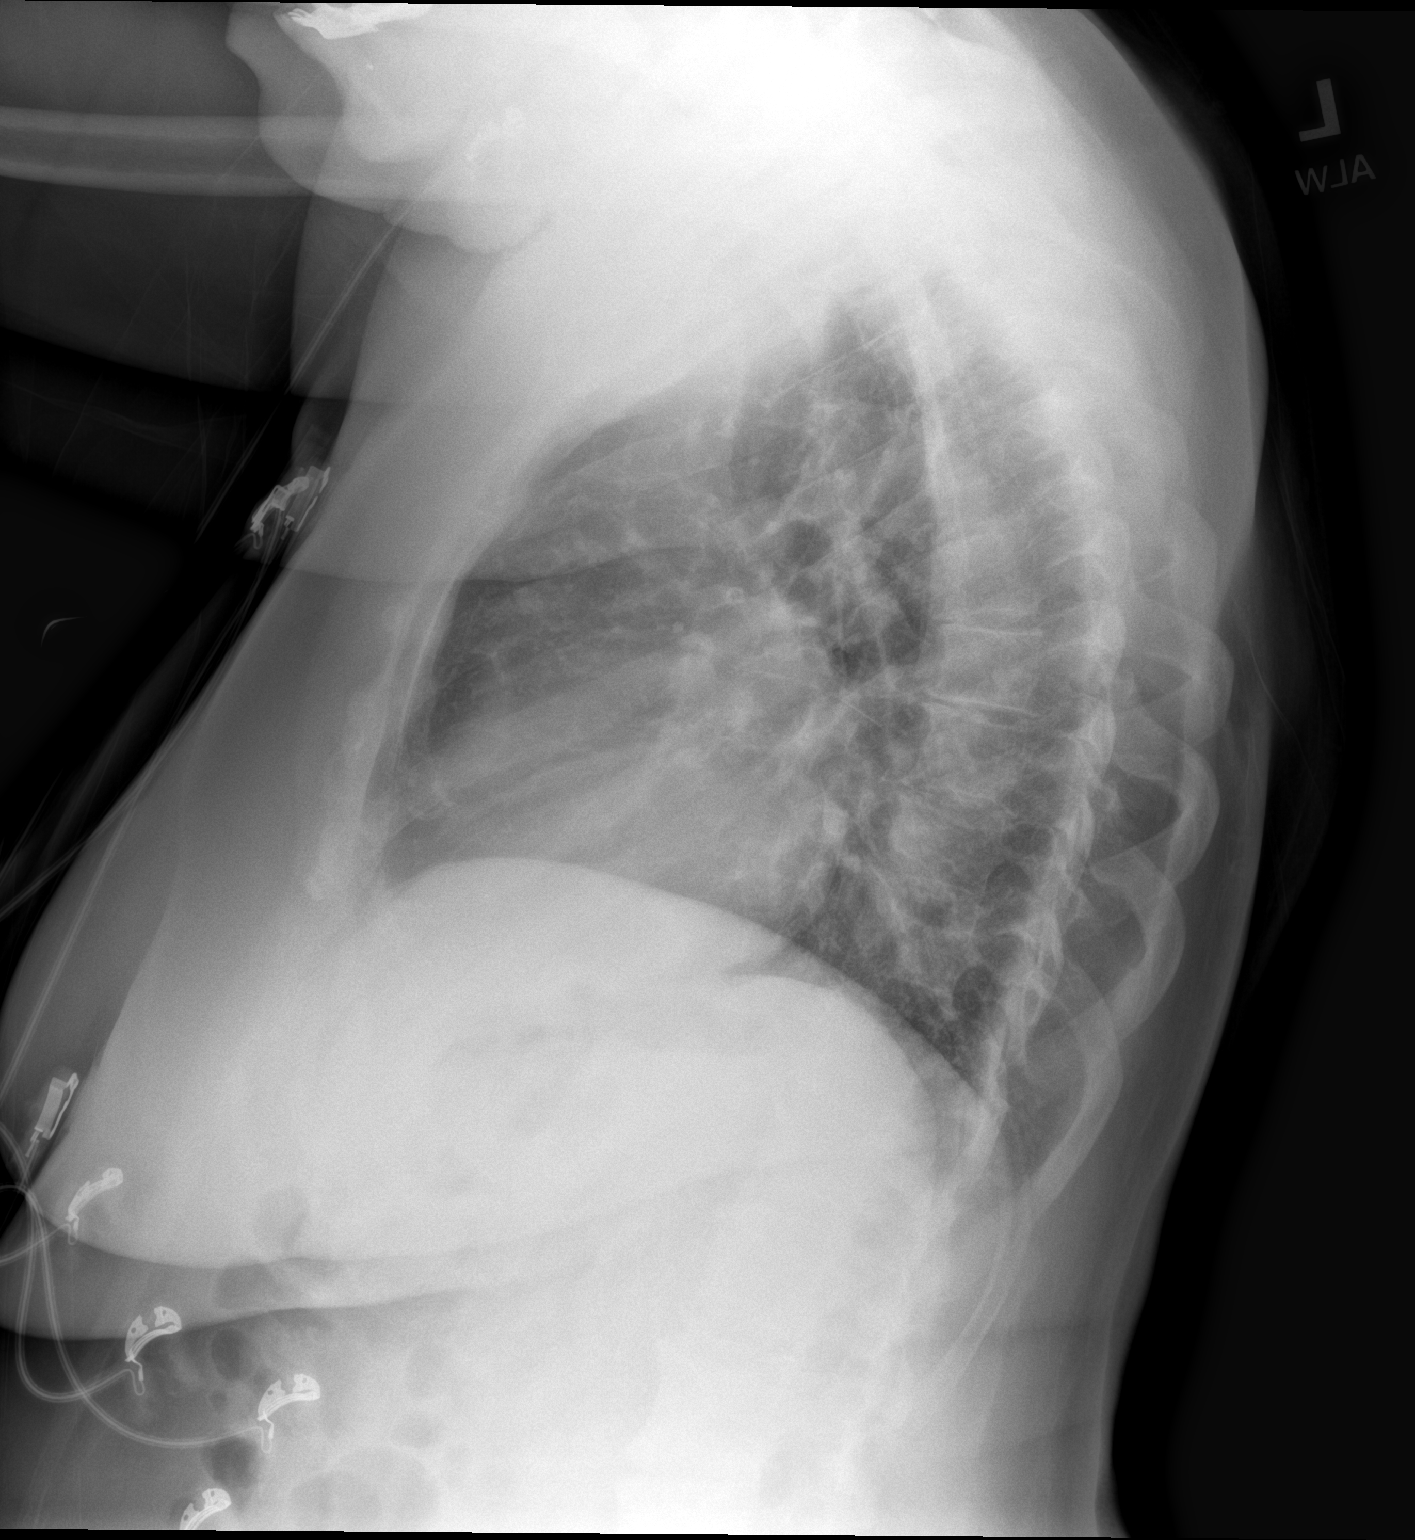

[2 of 2 positions shown; findings below may reference images not displayed]

FINDINGS: Similar enlargement the cardiac silhouette. Aortic atherosclerosis.
Both lungs are clear. No visible pleural effusions or pneumothorax.
No acute osseous abnormality. S-shaped thoracolumbar curvature.
IMPRESSION: 1. No acute cardiopulmonary disease.
2. Cardiomegaly.

## 2021-11-13 ENCOUNTER — Telehealth: Payer: Self-pay | Admitting: Internal Medicine

## 2021-11-13 NOTE — Telephone Encounter (Signed)
Spoke with pt in regards to chest discomfort, pt also describes what she feels like is an irregular HR like a "fluttering" in her chest that can last for a few minutes and then goes back to a "normal" rhythm. Pt also states an stabbing that happens in the middle of the chest, this does not happen very often but is notable when it happens. Pt states that she feels this sometimes when she is at work, she is retired but works in [employer] for a few hours a couple days a week. Pt has not been seen in over a year and was seen in the ED in May. Made an appointment for pt. Will send this to Dr. Debara Pickett to advise further.

## 2021-11-13 NOTE — Telephone Encounter (Signed)
Pt c/o of Chest Pain: STAT if CP now or developed within 24 hours  1. Are you having CP right now?  No   2. Are you experiencing any other symptoms (ex. SOB, nausea, vomiting, sweating)?  No   3. How long have you been experiencing CP?  Past few weeks  4. Is your CP continuous or coming and going?  Coming and going, with exertion  5. Have you taken Nitroglycerin?   No, patient states they gave her nitro during a recent hospital admission, but she hasn't taken any

## 2021-11-24 ENCOUNTER — Ambulatory Visit (INDEPENDENT_AMBULATORY_CARE_PROVIDER_SITE_OTHER): Payer: Medicare PPO

## 2021-11-24 ENCOUNTER — Other Ambulatory Visit: Payer: Self-pay

## 2021-11-24 ENCOUNTER — Ambulatory Visit: Payer: Medicare PPO | Admitting: Internal Medicine

## 2021-11-24 ENCOUNTER — Encounter: Payer: Self-pay | Admitting: Internal Medicine

## 2021-11-24 VITALS — BP 138/75 | HR 71 | Ht 63.0 in | Wt 220.6 lb

## 2021-11-24 DIAGNOSIS — R002 Palpitations: Secondary | ICD-10-CM

## 2021-11-24 DIAGNOSIS — R911 Solitary pulmonary nodule: Secondary | ICD-10-CM

## 2021-11-24 DIAGNOSIS — Z9989 Dependence on other enabling machines and devices: Secondary | ICD-10-CM

## 2021-11-24 DIAGNOSIS — G4733 Obstructive sleep apnea (adult) (pediatric): Secondary | ICD-10-CM | POA: Diagnosis not present

## 2021-11-24 DIAGNOSIS — R079 Chest pain, unspecified: Secondary | ICD-10-CM

## 2021-11-24 NOTE — Progress Notes (Unsigned)
Enrolled patient for a 14 day Zio XT  monitor to be mailed to patients home  °

## 2021-11-24 NOTE — Progress Notes (Signed)
OFFICE NOTE  Chief Complaint:  Chest pain, fluttering  Primary Care Physician: Janie Morning, DO  HPI:  [name] is a 71 year old female with a history of hypertension, dyslipidemia, obstructive sleep apnea on CPAP and palpitations. Recently, she reported her palpitations have improved, and we discussed in the past about possibly starting low-dose atenolol. However, she never had that prescription filled. One thing she pointed out is that she has been drinking Aqua Blue distilled water for some period of time. It is actually interesting because that water contains no minerals, and she may get very few minerals in her diet. Recently, she started taking mineral supplements, and she has reported that her palpitations have improved, suggesting it may be related to a mineral deficiency. Overall, she sleeps well with her CPAP and feels that that is working well for her until recently, when her machine broke. She has been using her cousin's. She also has dyslipidemia and her cholesterol is high on recent blood draw. This may be due to recent weight gain.  07/29/2020  [name] is seen today to reestablish care.  I last saw her in 2014 and therefore she is considered a new patient.  As above past medical history significant for hypertension, dyslipidemia, OSA on CPAP and some palpitations.  More recently she has had some palpitations however generally feels like they are well controlled.  EKG shows a sinus rhythm with first-degree AV block at 70.  Blood pressure is better today 134/76.  Recent lipids in May showed high LDL cholesterol 192 with total 258 and HDL 55 triglycerides 68.  Hemoglobin A1c was 5.3%.  The high cholesterol had been previously noted, however she is on no therapy for this.  Is not clear if she has had statin intolerance in the past.  11/03/2020  [name] returns today for follow-up.  She underwent calcium scoring due to significantly elevated cholesterol.  This  demonstrated a high coronary calcium score 466, 95th percentile for age and sex matched control.  Calcium was noted in the left main, proximal LAD and circumflex arteries.  There was an incidental finding of a 7 mm pulmonary nodule in the right lower lobe which was recommended to be followed up in 6 to 12 months.  We have ordered a 53-month CT chest nodule follow-up.  She is aware of this finding.  After some discussion I had recommended starting on high potency atorvastatin.  She did not start the medication when we recommended it in November rather wanted to wait until this appointment to discuss it further.  She had a number of concerns about statins, including the potential risk for developing diabetes, the fact that she might have significant permanent disability associated with it, and that it could deplete the body of coenzyme Q 10 which she would need to take to replace that.  I addressed all of these concerns which are typically poorly understood and not scientifically based Internet fodder.  She seemed reassured and understood the importance of statin therapy with regards to significant risk reduction given her extensive coronary disease.  In addition she is interested in starting an exercise program.  It is difficult at this point since she is fairly sedentary to understand whether or not she has any obstructive coronary disease.  We discussed the possibility of stress testing to determine her risk prior to starting an intense exercise program and per current American Society of nuclear cardiology guidelines given her calcium score greater than 400, Myoview stress testing is  considered appropriate.  11/24/2021  [name] is seen today in follow-up.  Recently she called in the office because of chest fluttering as well as sharp intermittent chest pains.  She notes that sometimes when taking her trash out but not all the time.  She says she gets discomfort that comes and goes.  She was seen in urgent  care for this but work-up was negative.  She also reports intermittent fluttering in her chest that happens maybe a few times a week.  She had Myoview stress testing last year because of a high calcium score which was negative for ischemia.  PMHx:  Past Medical History:  Diagnosis Date   Arthritis    Blood transfusion without reported diagnosis    Cataract    Diverticulitis    Fibroid    GERD (gastroesophageal reflux disease)    occ   Glaucoma    Heart murmur    History of echocardiogram 10/16/2011   EF >55%; borderline concentric LVH; mild MR, mild TR   History of palpitations    Hyperlipidemia    Hypertension    OSA on CPAP     Past Surgical History:  Procedure Laterality Date   ABDOMINAL HYSTERECTOMY  1991   TAH,BSO   BREAST SURGERY     REDUCTION MAMMAPLASTY   EYE SURGERY  2010   EYE SURGERY     shunt due to glaucoma   HERNIA REPAIR     INSERTION OF AHMED VALVE Left 05/04/2015   Procedure: REVISION AHMED VALVE LEFT EYE WITH TUTOPLAST AND AMNIOGRAFT;  Surgeon: Marylynn Pearson, MD;  Location: [facility];  Service: Ophthalmology;  Laterality: Left;   JOINT REPLACEMENT     OOPHORECTOMY     BSO   REPLACEMENT TOTAL KNEE Right 2008   TONSILLECTOMY      FAMHx:  Family History  Problem Relation Age of Onset   Cancer Mother        Bladder cancer   Hypertension Brother    COPD Brother    Hypertension Sister    Breast cancer Sister        Age 46   Cancer Sister        Bladder cancer   Stroke Maternal Grandmother    Colon cancer Neg Hx     SOCHx:   reports that she quit smoking about 41 years ago. Her smoking use included cigarettes. She has never used smokeless tobacco. She reports that she does not drink alcohol and does not use drugs.  ALLERGIES:  Allergies  Allergen Reactions   Aspirin     Can not take Aspirin and Sulfa in combination   Sulfa Antibiotics Hives    ROS: Pertinent items noted in HPI and remainder of comprehensive ROS otherwise negative.  HOME  MEDS: Current Outpatient Medications  Medication Sig Dispense Refill   acetaminophen (TYLENOL) 500 MG tablet Take 500 mg by mouth every 6 (six) hours as needed for mild pain or moderate pain.     atorvastatin (LIPITOR) 80 MG tablet Take 1 tablet (80 mg total) by mouth daily. 90 tablet 3   Brimonidine Tartrate-Timolol (COMBIGAN OP) Apply to eye.     cholecalciferol (VITAMIN D) 1000 UNITS tablet Take 2,000 Units by mouth daily.      dorzolamide-timolol (COSOPT) 22.3-6.8 MG/ML ophthalmic solution INSTILL 1 DROP INTO RIGHT EYE TWICE A DAY     estradiol (ESTRACE) 0.5 MG tablet TAKE 1 TABLET (0.5 MG TOTAL) BY MOUTH DAILY. 90 tablet 4   fish oil-omega-3 fatty acids  1000 MG capsule Take 2 g by mouth daily.     GARLIC PO Take by mouth.     hydrochlorothiazide (HYDRODIURIL) 12.5 MG tablet Take by mouth daily.     Magnesium 250 MG TABS Take by mouth.     nitroGLYCERIN (NITROSTAT) 0.4 MG SL tablet Place 1 tablet (0.4 mg total) under the tongue every 5 (five) minutes as needed for up to 30 doses for chest pain. Up to 2 doses in a row 30 tablet 0   vitamin E 1000 UNIT capsule Take 1,000 Units by mouth daily.     No current facility-administered medications for this visit.   Facility-Administered Medications Ordered in Other Visits  Medication Dose Route Frequency Provider Last Rate Last Admin   regadenoson (LEXISCAN) injection SOLN 0.4 mg  0.4 mg Intravenous Once Josue Hector, MD        LABS/IMAGING: No results found for this or any previous visit (from the past 48 hour(s)). No results found.  VITALS: BP 138/75    Pulse 71    Ht 5\' 3"  (1.6 m)    Wt 220 lb 9.6 oz (100.1 kg)    SpO2 99%    BMI 39.08 kg/m   EXAM: General appearance: alert and no distress Neck: no carotid bruit, no JVD, and thyroid not enlarged, symmetric, no tenderness/mass/nodules Lungs: clear to auscultation bilaterally Heart: regular rate and rhythm, S1, S2 normal, no murmur, click, rub or gallop Abdomen: soft, non-tender;  bowel sounds normal; no masses,  no organomegaly Extremities: extremities normal, atraumatic, no cyanosis or edema Pulses: 2+ and symmetric Skin: Skin color, texture, turgor normal. No rashes or lesions Neurologic: Grossly normal Psych: Pleasant  EKG: Sinus rhythm first-degree AV block at 71-personally reviewed  ASSESSMENT: Chest pain High CAC score 466, 95th percentile (09/2020) 7 mm right lower lobe pulmonary nodule-scheduled for follow-up chest CT in 6 months Obstructive sleep apnea on CPAP Hypertension Dyslipidemia- ?familial hyperlipidemia Palpitations Morbid obesity  PLAN: 1.   [name] has again been experiencing some chest pain and palpitations.  Her pain is intermittent and sharp and not always associated with exertion or relieved by rest.  It does not sound typical for angina.  Myoview stress testing was negative last year.  I will go ahead and start low-dose aspirin because of her high calcium score.  She also is describing palpitations.  I wonder rule out atrial fibrillation and we will go ahead and order a 2-week monitor.  I suspect we might want to start a low-dose beta-blocker to see if this could provide her with multiple benefits.  Plan follow-up with me afterwards.  Pixie Casino, MD, [facility], [place] Director of the [facility] Lipid [facility] &  Cardiovascular Risk Reduction Clinic Diplomate of the American Board of Clinical Lipidology Attending Cardiologist  Direct Dial: [phone]   Fax: [phone]  Website:  [email or web address]  11/24/2021, 8:44 AM

## 2021-11-24 NOTE — Patient Instructions (Signed)
Medication Instructions:  START aspirin 81mg  daily  *If you need a refill on your cardiac medications before your next appointment, please call your pharmacy*   Testing/Procedures: Chest CT to follow up on lung nodule at [place] - you will get a call to schedule  ZIO XT- Long Term Monitor Instructions  Your physician has requested you wear a ZIO patch monitor for 14 days.  This is a single patch monitor. [employer] supplies one patch monitor per enrollment. Additional stickers are not available. Please do not apply patch if you will be having a Nuclear Stress Test,  Echocardiogram, Cardiac CT, MRI, or Chest Xray during the period you would be wearing the  monitor. The patch cannot be worn during these tests. You cannot remove and re-apply the  ZIO XT patch monitor.  Your ZIO patch monitor will be mailed 3 day USPS to your address on file. It may take 3-5 days  to receive your monitor after you have been enrolled.  Once you have received your monitor, please review the enclosed instructions. Your monitor  has already been registered assigning a specific monitor serial # to you.  Billing and Patient Assistance Program Information  We have supplied [employer] with any of your insurance information on file for billing purposes. [employer] offers a sliding scale Patient Assistance Program for patients that do not have  insurance, or whose insurance does not completely cover the cost of the ZIO monitor.  You must apply for the Patient Assistance Program to qualify for this discounted rate.  To apply, please call [employer] at [phone], select option 4, select option 2, ask to apply for  Patient Assistance Program. Theodore Demark will ask your household income, and how many people  are in your household. They will quote your out-of-pocket cost based on that information.  [employer] will also be able to set up a 37-month, interest-free payment plan if needed.  Applying the monitor   Shave hair  from upper left chest.  Hold abrader disc by orange tab. Rub abrader in 40 strokes over the upper left chest as  indicated in your monitor instructions.  Clean area with 4 enclosed alcohol pads. Let dry.  Apply patch as indicated in monitor instructions. Patch will be placed under collarbone on left  side of chest with arrow pointing upward.  Rub patch adhesive wings for 2 minutes. Remove white label marked "1". Remove the white  label marked "2". Rub patch adhesive wings for 2 additional minutes.  While looking in a mirror, press and release button in center of patch. A small green light will  flash 3-4 times. This will be your only indicator that the monitor has been turned on.  Do not shower for the first 24 hours. You may shower after the first 24 hours.  Press the button if you feel a symptom. You will hear a small click. Record Date, Time and  Symptom in the Patient Logbook.  When you are ready to remove the patch, follow instructions on the last 2 pages of Patient  Logbook. Stick patch monitor onto the last page of Patient Logbook.  Place Patient Logbook in the blue and white box. Use locking tab on box and tape box closed  securely. The blue and white box has prepaid postage on it. Please place it in the mailbox as  soon as possible. Your physician should have your test results approximately 7 days after the  monitor has been mailed back to [facility].  Call [employer]  [facility] at [phone] if you have questions regarding  your ZIO XT patch monitor. Call them immediately if you see an orange light blinking on your  monitor.  If your monitor falls off in less than 4 days, contact our Monitor department at [phone].  If your monitor becomes loose or falls off after 4 days call [employer] at [phone] for  suggestions on securing your monitor    Follow-Up: At [facility], you and your health needs are our priority.  As part of our continuing mission to  provide you with exceptional heart care, we have created designated Provider Care Teams.  These Care Teams include your primary Cardiologist (physician) and Advanced Practice Providers (APPs -  Physician Assistants and Nurse Practitioners) who all work together to provide you with the care you need, when you need it.  We recommend signing up for the patient portal called "MyChart".  Sign up information is provided on this After Visit Summary.  MyChart is used to connect with patients for Virtual Visits (Telemedicine).  Patients are able to view lab/test results, encounter notes, upcoming appointments, etc.  Non-urgent messages can be sent to your provider as well.   To learn more about what you can do with MyChart, go to [email or web address].    Your next appointment:   Jan 05, 2022 @ 2:30pm

## 2021-11-30 DIAGNOSIS — R002 Palpitations: Secondary | ICD-10-CM

## 2021-12-22 DIAGNOSIS — R002 Palpitations: Secondary | ICD-10-CM | POA: Diagnosis not present

## 2022-01-01 ENCOUNTER — Other Ambulatory Visit: Payer: Self-pay

## 2022-01-01 ENCOUNTER — Ambulatory Visit
Admission: RE | Admit: 2022-01-01 | Discharge: 2022-01-01 | Disposition: A | Discharge status: Home / Self Care | Payer: Medicare PPO | Source: Ambulatory Visit | Attending: Internal Medicine | Admitting: Internal Medicine

## 2022-01-01 DIAGNOSIS — R911 Solitary pulmonary nodule: Secondary | ICD-10-CM

## 2022-01-05 ENCOUNTER — Encounter: Payer: Self-pay | Admitting: Internal Medicine

## 2022-01-05 ENCOUNTER — Other Ambulatory Visit: Payer: Self-pay

## 2022-01-05 ENCOUNTER — Ambulatory Visit: Payer: Medicare PPO | Admitting: Internal Medicine

## 2022-01-05 VITALS — BP 162/88 | HR 70 | Ht 63.0 in | Wt 219.2 lb

## 2022-01-05 DIAGNOSIS — I10 Essential (primary) hypertension: Secondary | ICD-10-CM | POA: Diagnosis not present

## 2022-01-05 DIAGNOSIS — R079 Chest pain, unspecified: Secondary | ICD-10-CM | POA: Diagnosis not present

## 2022-01-05 DIAGNOSIS — R002 Palpitations: Secondary | ICD-10-CM

## 2022-01-05 DIAGNOSIS — Z9989 Dependence on other enabling machines and devices: Secondary | ICD-10-CM | POA: Diagnosis not present

## 2022-01-05 DIAGNOSIS — G4733 Obstructive sleep apnea (adult) (pediatric): Secondary | ICD-10-CM

## 2022-01-05 NOTE — Patient Instructions (Signed)
Medication Instructions:  Your physician recommends that you continue on your current medications as directed. Please refer to the Current Medication list given to you today.  *If you need a refill on your cardiac medications before your next appointment, please call your pharmacy*  Follow-Up: At [facility], you and your health needs are our priority.  As part of our continuing mission to provide you with exceptional heart care, we have created designated Provider Care Teams.  These Care Teams include your primary Cardiologist (physician) and Advanced Practice Providers (APPs -  Physician Assistants and Nurse Practitioners) who all work together to provide you with the care you need, when you need it.  We recommend signing up for the patient portal called "MyChart".  Sign up information is provided on this After Visit Summary.  MyChart is used to connect with patients for Virtual Visits (Telemedicine).  Patients are able to view lab/test results, encounter notes, upcoming appointments, etc.  Non-urgent messages can be sent to your provider as well.   To learn more about what you can do with MyChart, go to [email or web address].    Your next appointment:   1 year with Dr. Debara Pickett

## 2022-01-05 NOTE — Progress Notes (Signed)
OFFICE NOTE  Chief Complaint:  Follow-up chest pain, fluttering  Primary Care Physician: Janie Morning, DO  HPI:  [name] is a 71 year old female with a history of hypertension, dyslipidemia, obstructive sleep apnea on CPAP and palpitations. Recently, she reported her palpitations have improved, and we discussed in the past about possibly starting low-dose atenolol. However, she never had that prescription filled. One thing she pointed out is that she has been drinking Aqua Blue distilled water for some period of time. It is actually interesting because that water contains no minerals, and she may get very few minerals in her diet. Recently, she started taking mineral supplements, and she has reported that her palpitations have improved, suggesting it may be related to a mineral deficiency. Overall, she sleeps well with her CPAP and feels that that is working well for her until recently, when her machine broke. She has been using her cousin's. She also has dyslipidemia and her cholesterol is high on recent blood draw. This may be due to recent weight gain.  07/29/2020  [name] is seen today to reestablish care.  I last saw her in 2014 and therefore she is considered a new patient.  As above past medical history significant for hypertension, dyslipidemia, OSA on CPAP and some palpitations.  More recently she has had some palpitations however generally feels like they are well controlled.  EKG shows a sinus rhythm with first-degree AV block at 70.  Blood pressure is better today 134/76.  Recent lipids in May showed high LDL cholesterol 192 with total 258 and HDL 55 triglycerides 68.  Hemoglobin A1c was 5.3%.  The high cholesterol had been previously noted, however she is on no therapy for this.  Is not clear if she has had statin intolerance in the past.  11/03/2020  [name] returns today for follow-up.  She underwent calcium scoring due to significantly elevated cholesterol.   This demonstrated a high coronary calcium score 466, 95th percentile for age and sex matched control.  Calcium was noted in the left main, proximal LAD and circumflex arteries.  There was an incidental finding of a 7 mm pulmonary nodule in the right lower lobe which was recommended to be followed up in 6 to 12 months.  We have ordered a 61-month CT chest nodule follow-up.  She is aware of this finding.  After some discussion I had recommended starting on high potency atorvastatin.  She did not start the medication when we recommended it in November rather wanted to wait until this appointment to discuss it further.  She had a number of concerns about statins, including the potential risk for developing diabetes, the fact that she might have significant permanent disability associated with it, and that it could deplete the body of coenzyme Q 10 which she would need to take to replace that.  I addressed all of these concerns which are typically poorly understood and not scientifically based Internet fodder.  She seemed reassured and understood the importance of statin therapy with regards to significant risk reduction given her extensive coronary disease.  In addition she is interested in starting an exercise program.  It is difficult at this point since she is fairly sedentary to understand whether or not she has any obstructive coronary disease.  We discussed the possibility of stress testing to determine her risk prior to starting an intense exercise program and per current American Society of nuclear cardiology guidelines given her calcium score greater than 400, Myoview stress testing  is considered appropriate.  11/24/2021  [name] is seen today in follow-up.  Recently she called in the office because of chest fluttering as well as sharp intermittent chest pains.  She notes that sometimes when taking her trash out but not all the time.  She says she gets discomfort that comes and goes.  She was seen in  urgent care for this but work-up was negative.  She also reports intermittent fluttering in her chest that happens maybe a few times a week.  She had Myoview stress testing last year because of a high calcium score which was negative for ischemia.  01/05/2022  [name] seen today for follow-up of chest pain and chest fluttering.  She reports her chest fluttering and pain have resolved.  She was noted to have a short episode of SVT on the monitor but she had then made dietary changes including stopping caffeine and noticed that her symptoms have resolved.  Blood pressure was elevated today however she says home blood pressures are better controlled.  We did repeat a CT scan for follow-up of a nodule and that was unchanged, suggesting that the nodules are benign.  PMHx:  Past Medical History:  Diagnosis Date   Arthritis    Blood transfusion without reported diagnosis    Cataract    Diverticulitis    Fibroid    GERD (gastroesophageal reflux disease)    occ   Glaucoma    Heart murmur    History of echocardiogram 10/16/2011   EF >55%; borderline concentric LVH; mild MR, mild TR   History of palpitations    Hyperlipidemia    Hypertension    OSA on CPAP     Past Surgical History:  Procedure Laterality Date   ABDOMINAL HYSTERECTOMY  1991   TAH,BSO   BREAST SURGERY     REDUCTION MAMMAPLASTY   EYE SURGERY  2010   EYE SURGERY     shunt due to glaucoma   HERNIA REPAIR     INSERTION OF AHMED VALVE Left 05/04/2015   Procedure: REVISION AHMED VALVE LEFT EYE WITH TUTOPLAST AND AMNIOGRAFT;  Surgeon: Marylynn Pearson, MD;  Location: [place];  Service: Ophthalmology;  Laterality: Left;   JOINT REPLACEMENT     OOPHORECTOMY     BSO   REPLACEMENT TOTAL KNEE Right 2008   TONSILLECTOMY      FAMHx:  Family History  Problem Relation Age of Onset   Cancer Mother        Bladder cancer   Hypertension Brother    COPD Brother    Hypertension Sister    Breast cancer Sister        Age 28   Cancer  Sister        Bladder cancer   Stroke Maternal Grandmother    Colon cancer Neg Hx     SOCHx:   reports that she quit smoking about 41 years ago. Her smoking use included cigarettes. She has never used smokeless tobacco. She reports that she does not drink alcohol and does not use drugs.  ALLERGIES:  Allergies  Allergen Reactions   Aspirin     Can not take Aspirin and Sulfa in combination   Sulfa Antibiotics Hives    ROS: Pertinent items noted in HPI and remainder of comprehensive ROS otherwise negative.  HOME MEDS: Current Outpatient Medications  Medication Sig Dispense Refill   acetaminophen (TYLENOL) 500 MG tablet Take 500 mg by mouth every 6 (six) hours as needed for mild pain or moderate  pain.     aspirin EC 81 MG tablet Take 81 mg by mouth daily. Swallow whole.     cholecalciferol (VITAMIN D) 1000 UNITS tablet Take 2,000 Units by mouth daily.      dorzolamide-timolol (COSOPT) 22.3-6.8 MG/ML ophthalmic solution INSTILL 1 DROP INTO RIGHT EYE TWICE A DAY     estradiol (ESTRACE) 0.5 MG tablet TAKE 1 TABLET (0.5 MG TOTAL) BY MOUTH DAILY. 90 tablet 4   fish oil-omega-3 fatty acids 1000 MG capsule Take 2 g by mouth daily.     GARLIC PO Take by mouth daily in the afternoon.     hydrochlorothiazide (HYDRODIURIL) 12.5 MG tablet Take by mouth daily.     Magnesium 250 MG TABS Take by mouth.     vitamin E 1000 UNIT capsule Take 1,000 Units by mouth daily.     atorvastatin (LIPITOR) 80 MG tablet Take 1 tablet (80 mg total) by mouth daily. (Patient not taking: Reported on 01/05/2022) 90 tablet 3   Brimonidine Tartrate-Timolol (COMBIGAN OP) Apply to eye. (Patient not taking: Reported on 01/05/2022)     cetirizine (ZYRTEC) 10 MG tablet 1 tablet (Patient not taking: Reported on 01/05/2022)     nitroGLYCERIN (NITROSTAT) 0.4 MG SL tablet Place 1 tablet (0.4 mg total) under the tongue every 5 (five) minutes as needed for up to 30 doses for chest pain. Up to 2 doses in a row (Patient not taking:  Reported on 01/05/2022) 30 tablet 0   No current facility-administered medications for this visit.   Facility-Administered Medications Ordered in Other Visits  Medication Dose Route Frequency Provider Last Rate Last Admin   regadenoson (LEXISCAN) injection SOLN 0.4 mg  0.4 mg Intravenous Once Josue Hector, MD        LABS/IMAGING: No results found for this or any previous visit (from the past 48 hour(s)). No results found.  VITALS: BP (!) 162/88 (BP Location: Left Arm, Patient Position: Sitting, Cuff Size: Large)    Pulse 70    Ht 5\' 3"  (1.6 m)    Wt 219 lb 3.2 oz (99.4 kg)    SpO2 99%    BMI 38.83 kg/m   EXAM: General appearance: alert and no distress Neck: no carotid bruit, no JVD, and thyroid not enlarged, symmetric, no tenderness/mass/nodules Lungs: clear to auscultation bilaterally Heart: regular rate and rhythm, S1, S2 normal, no murmur, click, rub or gallop Abdomen: soft, non-tender; bowel sounds normal; no masses,  no organomegaly Extremities: extremities normal, atraumatic, no cyanosis or edema Pulses: 2+ and symmetric Skin: Skin color, texture, turgor normal. No rashes or lesions Neurologic: Grossly normal Psych: Pleasant  EKG: N/A  ASSESSMENT: Chest pain -probably related to SVT, improved with dietary modification High CAC score 466, 95th percentile (09/2020) 7 mm right lower lobe pulmonary nodule-scheduled for follow-up chest CT in 6 months Obstructive sleep apnea on CPAP Hypertension Dyslipidemia- ?familial hyperlipidemia Palpitations Morbid obesity  PLAN: 1.   [name] had chest discomfort likely related to SVT but has improved with dietary modification including stopping her caffeine intake.  She is compliant with CPAP.  We discussed possibility of adding a beta-blocker if her symptoms return or worsen.  Otherwise no further work-up at this time.  Plan follow-up with me annually or sooner as necessary  Pixie Casino, MD, [facility], [place] Director of the [facility] Lip[facility]  Cardiovascular [facility] Diplomate of the American Board of Clinical Lipidology Attending Cardiologist  Direct  Dial: [phone]   Fax: [phone]  Website:  [email or web address]  01/05/2022, 2:35 PM

## 2022-02-06 DIAGNOSIS — M1712 Unilateral primary osteoarthritis, left knee: Secondary | ICD-10-CM | POA: Diagnosis not present

## 2022-03-01 DIAGNOSIS — H5712 Ocular pain, left eye: Secondary | ICD-10-CM | POA: Diagnosis not present

## 2022-03-01 DIAGNOSIS — H401133 Primary open-angle glaucoma, bilateral, severe stage: Secondary | ICD-10-CM | POA: Diagnosis not present

## 2022-03-14 DIAGNOSIS — M1712 Unilateral primary osteoarthritis, left knee: Secondary | ICD-10-CM | POA: Diagnosis not present

## 2022-03-14 DIAGNOSIS — M25562 Pain in left knee: Secondary | ICD-10-CM | POA: Diagnosis not present

## 2022-03-14 DIAGNOSIS — Z96651 Presence of right artificial knee joint: Secondary | ICD-10-CM | POA: Diagnosis not present

## 2022-04-07 ENCOUNTER — Encounter: Payer: Self-pay | Admitting: Gastroenterology

## 2022-04-10 DIAGNOSIS — R7309 Other abnormal glucose: Secondary | ICD-10-CM | POA: Diagnosis not present

## 2022-04-10 DIAGNOSIS — E559 Vitamin D deficiency, unspecified: Secondary | ICD-10-CM | POA: Diagnosis not present

## 2022-04-10 DIAGNOSIS — E7800 Pure hypercholesterolemia, unspecified: Secondary | ICD-10-CM | POA: Diagnosis not present

## 2022-04-10 DIAGNOSIS — I10 Essential (primary) hypertension: Secondary | ICD-10-CM | POA: Diagnosis not present

## 2022-04-10 DIAGNOSIS — R7989 Other specified abnormal findings of blood chemistry: Secondary | ICD-10-CM | POA: Diagnosis not present

## 2022-04-17 DIAGNOSIS — R7989 Other specified abnormal findings of blood chemistry: Secondary | ICD-10-CM | POA: Diagnosis not present

## 2022-04-17 DIAGNOSIS — I6529 Occlusion and stenosis of unspecified carotid artery: Secondary | ICD-10-CM | POA: Diagnosis not present

## 2022-04-17 DIAGNOSIS — G4733 Obstructive sleep apnea (adult) (pediatric): Secondary | ICD-10-CM | POA: Diagnosis not present

## 2022-04-17 DIAGNOSIS — J309 Allergic rhinitis, unspecified: Secondary | ICD-10-CM | POA: Diagnosis not present

## 2022-04-17 DIAGNOSIS — R7309 Other abnormal glucose: Secondary | ICD-10-CM | POA: Diagnosis not present

## 2022-04-17 DIAGNOSIS — E7800 Pure hypercholesterolemia, unspecified: Secondary | ICD-10-CM | POA: Diagnosis not present

## 2022-04-17 DIAGNOSIS — E559 Vitamin D deficiency, unspecified: Secondary | ICD-10-CM | POA: Diagnosis not present

## 2022-04-17 DIAGNOSIS — I10 Essential (primary) hypertension: Secondary | ICD-10-CM | POA: Diagnosis not present

## 2022-04-17 DIAGNOSIS — H409 Unspecified glaucoma: Secondary | ICD-10-CM | POA: Diagnosis not present

## 2022-04-17 DIAGNOSIS — E669 Obesity, unspecified: Secondary | ICD-10-CM | POA: Diagnosis not present

## 2022-04-17 DIAGNOSIS — Z0000 Encounter for general adult medical examination without abnormal findings: Secondary | ICD-10-CM | POA: Diagnosis not present

## 2022-06-23 DIAGNOSIS — Z1231 Encounter for screening mammogram for malignant neoplasm of breast: Secondary | ICD-10-CM | POA: Diagnosis not present

## 2022-07-02 DIAGNOSIS — N6313 Unspecified lump in the right breast, lower outer quadrant: Secondary | ICD-10-CM | POA: Diagnosis not present

## 2022-07-02 DIAGNOSIS — R928 Other abnormal and inconclusive findings on diagnostic imaging of breast: Secondary | ICD-10-CM | POA: Diagnosis not present

## 2022-07-04 ENCOUNTER — Encounter (INDEPENDENT_AMBULATORY_CARE_PROVIDER_SITE_OTHER): Payer: Self-pay

## 2022-07-04 ENCOUNTER — Other Ambulatory Visit: Payer: Self-pay | Admitting: Radiology

## 2022-07-04 DIAGNOSIS — N6313 Unspecified lump in the right breast, lower outer quadrant: Secondary | ICD-10-CM | POA: Diagnosis not present

## 2022-07-04 DIAGNOSIS — N6011 Diffuse cystic mastopathy of right breast: Secondary | ICD-10-CM | POA: Diagnosis not present

## 2022-07-24 DIAGNOSIS — H401133 Primary open-angle glaucoma, bilateral, severe stage: Secondary | ICD-10-CM | POA: Diagnosis not present

## 2022-11-16 DIAGNOSIS — I10 Essential (primary) hypertension: Secondary | ICD-10-CM | POA: Diagnosis not present

## 2022-11-16 DIAGNOSIS — R051 Acute cough: Secondary | ICD-10-CM | POA: Diagnosis not present

## 2022-11-16 DIAGNOSIS — J09X2 Influenza due to identified novel influenza A virus with other respiratory manifestations: Secondary | ICD-10-CM | POA: Diagnosis not present

## 2022-11-23 DIAGNOSIS — M1712 Unilateral primary osteoarthritis, left knee: Secondary | ICD-10-CM | POA: Diagnosis not present

## 2023-01-16 DIAGNOSIS — G4733 Obstructive sleep apnea (adult) (pediatric): Secondary | ICD-10-CM | POA: Diagnosis not present

## 2023-01-21 ENCOUNTER — Other Ambulatory Visit: Payer: Self-pay | Admitting: Internal Medicine

## 2023-01-21 DIAGNOSIS — Z1231 Encounter for screening mammogram for malignant neoplasm of breast: Secondary | ICD-10-CM

## 2023-01-30 DIAGNOSIS — G4733 Obstructive sleep apnea (adult) (pediatric): Secondary | ICD-10-CM | POA: Diagnosis not present

## 2023-04-16 ENCOUNTER — Ambulatory Visit: Payer: Medicare PPO | Admitting: Podiatry

## 2023-04-16 DIAGNOSIS — E559 Vitamin D deficiency, unspecified: Secondary | ICD-10-CM | POA: Diagnosis not present

## 2023-04-16 DIAGNOSIS — R946 Abnormal results of thyroid function studies: Secondary | ICD-10-CM | POA: Diagnosis not present

## 2023-04-16 DIAGNOSIS — Z79899 Other long term (current) drug therapy: Secondary | ICD-10-CM | POA: Diagnosis not present

## 2023-04-16 DIAGNOSIS — R7309 Other abnormal glucose: Secondary | ICD-10-CM | POA: Diagnosis not present

## 2023-04-16 DIAGNOSIS — Z13220 Encounter for screening for lipoid disorders: Secondary | ICD-10-CM | POA: Diagnosis not present

## 2023-04-30 DIAGNOSIS — M5136 Other intervertebral disc degeneration, lumbar region: Secondary | ICD-10-CM | POA: Diagnosis not present

## 2023-04-30 DIAGNOSIS — N1831 Chronic kidney disease, stage 3a: Secondary | ICD-10-CM | POA: Diagnosis not present

## 2023-04-30 DIAGNOSIS — I129 Hypertensive chronic kidney disease with stage 1 through stage 4 chronic kidney disease, or unspecified chronic kidney disease: Secondary | ICD-10-CM | POA: Diagnosis not present

## 2023-04-30 DIAGNOSIS — H6120 Impacted cerumen, unspecified ear: Secondary | ICD-10-CM | POA: Diagnosis not present

## 2023-04-30 DIAGNOSIS — E7801 Familial hypercholesterolemia: Secondary | ICD-10-CM | POA: Diagnosis not present

## 2023-04-30 DIAGNOSIS — M5134 Other intervertebral disc degeneration, thoracic region: Secondary | ICD-10-CM | POA: Diagnosis not present

## 2023-04-30 DIAGNOSIS — M419 Scoliosis, unspecified: Secondary | ICD-10-CM | POA: Diagnosis not present

## 2023-04-30 DIAGNOSIS — H409 Unspecified glaucoma: Secondary | ICD-10-CM | POA: Diagnosis not present

## 2023-04-30 DIAGNOSIS — Z0000 Encounter for general adult medical examination without abnormal findings: Secondary | ICD-10-CM | POA: Diagnosis not present

## 2023-05-23 DIAGNOSIS — M1712 Unilateral primary osteoarthritis, left knee: Secondary | ICD-10-CM | POA: Diagnosis not present

## 2023-05-23 DIAGNOSIS — Z96651 Presence of right artificial knee joint: Secondary | ICD-10-CM | POA: Diagnosis not present

## 2023-05-23 DIAGNOSIS — Z471 Aftercare following joint replacement surgery: Secondary | ICD-10-CM | POA: Diagnosis not present

## 2023-06-05 DIAGNOSIS — H401133 Primary open-angle glaucoma, bilateral, severe stage: Secondary | ICD-10-CM | POA: Diagnosis not present

## 2023-06-05 DIAGNOSIS — H04123 Dry eye syndrome of bilateral lacrimal glands: Secondary | ICD-10-CM | POA: Diagnosis not present

## 2023-06-05 DIAGNOSIS — H47233 Glaucomatous optic atrophy, bilateral: Secondary | ICD-10-CM | POA: Diagnosis not present

## 2023-06-21 ENCOUNTER — Encounter: Payer: Self-pay | Admitting: Gastroenterology

## 2023-06-28 DIAGNOSIS — M5136 Other intervertebral disc degeneration, lumbar region: Secondary | ICD-10-CM | POA: Insufficient documentation

## 2023-06-28 DIAGNOSIS — E7801 Familial hypercholesterolemia: Secondary | ICD-10-CM | POA: Insufficient documentation

## 2023-06-28 DIAGNOSIS — M419 Scoliosis, unspecified: Secondary | ICD-10-CM | POA: Insufficient documentation

## 2023-06-28 DIAGNOSIS — N1831 Chronic kidney disease, stage 3a: Secondary | ICD-10-CM | POA: Insufficient documentation

## 2023-06-28 DIAGNOSIS — I129 Hypertensive chronic kidney disease with stage 1 through stage 4 chronic kidney disease, or unspecified chronic kidney disease: Secondary | ICD-10-CM | POA: Insufficient documentation

## 2023-06-28 DIAGNOSIS — M5134 Other intervertebral disc degeneration, thoracic region: Secondary | ICD-10-CM | POA: Insufficient documentation

## 2023-06-28 DIAGNOSIS — Z9109 Other allergy status, other than to drugs and biological substances: Secondary | ICD-10-CM | POA: Insufficient documentation

## 2023-07-01 DIAGNOSIS — G4733 Obstructive sleep apnea (adult) (pediatric): Secondary | ICD-10-CM | POA: Diagnosis not present

## 2023-07-03 ENCOUNTER — Ambulatory Visit: Payer: Medicare PPO | Admitting: Family Medicine

## 2023-07-03 ENCOUNTER — Encounter: Payer: Self-pay | Admitting: Family Medicine

## 2023-07-03 VITALS — BP 152/77 | HR 68 | Ht 63.0 in | Wt 216.5 lb

## 2023-07-03 DIAGNOSIS — G4733 Obstructive sleep apnea (adult) (pediatric): Secondary | ICD-10-CM

## 2023-07-03 NOTE — Patient Instructions (Signed)

## 2023-07-03 NOTE — Progress Notes (Signed)
PATIENT: [name] DOB: [birth year]  REASON FOR VISIT: follow up HISTORY FROM: patient  Chief Complaint  Patient presents with   Room 1    Pt is here Alone. Pt states that things have been going good with her CPAP Machine. Pt states that she doesn't have any complaints to report today.      HISTORY OF PRESENT ILLNESS:  07/03/23 ALL: [name] returns for follow up for OSA on CPAP. She was last seen 2021. She reports doing well. She is using therapy nightly for about 8-9 hours, on average. She denies concerns with machine or supplies. She is using a nasal pillow style mask. She admits that she does not change it out as often as she should. She is sleeping well and without concerns, today. She is followed annually by PCP.     07/25/2020 ALL: [name] is a 72 y.o. female here today for follow up for OSA on CPAP. She is doing very well. She reports that she uses her machine every night. She was caring for her aunt for 2 weeks and was not able to use her machine due to getting up and down all night. Otherwise, she has been exceptionally compliant. She does feel better rested and more energized on therapy. She is working part time in [employer] at an [employer].   Compliance report dated 06/24/2020 through 07/23/2020 reveals that she used CPAP 23 of the past 30 days for compliance of 77%.  She used CPAP greater than 4 hours all 23 of the past 30 days, again for compliance of 77%.  Average usage on days used was 8 hours and 38 minutes.  Residual AHI was 0.6 on 6 to 16 cm of water pressure and an EPR of 3.  There was no significant leak noted.   HISTORY: (copied from Dr Dohmeier's note on 07/22/2019)  HPI: [name] is a 72 y.o. female patient  Who last had a telephone visit with Shawnie Dapper , NP on 04-21-2019, at a time she was not yet set up her new CPAP.  She had undergone a HST on 02-02-2019.   :STUDY RESULTS:  Total Recording Time: 9 h,1 min; Total Sleep Time: 7  h 29 min. Total Apnea/Hypopnea Index (AHI): 32.8 /h; RDI: 33.2 /h; REM AHI:  49.0/h. Average Oxygen Saturation:  95%; Lowest Oxygen Desaturation: 87 %.  Total Time Oxygen Saturation below 89 %: 0.2 minutes.  Average Heart Rate: 61 bpm (between 54 and 78 bpm). IMPRESSION:  Severe OSA at AHI of 32.8/h with some REM accentuation to 49/h. No clinically significant hypoxemia.  Regular heart rate.  RECOMMENDATION: The patient requested a new mask to be fitted, she will continue CPAP therapy with an autotitration machine, heated humidity and a setting from 6-16 cm water, 1 cm EPR.  Melvyn Novas, M.D.  02-10-2019    follow up on CPAP today- [name] presents today with excellent compliance data.  She has used her CPAP 100% of the last 30 days by days and by hours the last night recorded was 19 July 2019.  Her AutoSet CPAP is between 6 and 16 cm water pressure with 3 cm EPR, her residual AHI is 0.5/h.  This is an excellent resolution with no central apneas arising.   The 95th percentile pressure was 10.4 cmH2O and felt well within the currently prescribed pressure window. Based on her compliance data I am extremely happy.     [name] is a 72  y.o. female patient seen on 01-19-2019 upon a referral  from Dr. Thomasena Edis , MD / Dr. Thomasena Edis, DO for a sleep evaluation.  She is a known OSA patient and currently on her second CPAP, which she uses daily. Mrs. Janousek also has a history of hypertension, she is at this time considered morbidly obese as her body mass index is approached 28, she has high cholesterol, glaucoma, she has an abdominal umbilical hernia, breast reduction surgery surgery, hysterectomy and several eye surgeries.  Also listed are chronic kidney disease but no grade was named, hypertension and allergic rhinitis.   I reviewed the current medication,   Chief complaint according to patient : " I know I have to sleep with a CPAP but I never had a new machine in over 10 years."    Sleep habits are as follows:  Dinner time is 5 PM and sleep time is 10.00 PM.  Bedroom is cool, quiet and dark. She sleeps supine, on one pillow, in a flat bed. She dreams, but is not known to act out.  She does not snore while on CPAP, has no dry mouth, but nocturia times 2-3 times. Wakes up at 6.30 and rises- refreshed and restored. She is using nasal pillows, a bella swift with ear loops.    Sleep medical history: see above    Family sleep history: unknown.   Social history: lives alone, childless. The patient is retired but she still has a part-time job, she raised her nephew, her husband died in 29.  She is a former smoker but quit in 1982, she does not drink alcohol, she drinks coffee but mixes it 50-50 with decaf.   REVIEW OF SYSTEMS: Out of a complete 14 system review of symptoms, the patient complains only of the following symptoms, none and all other reviewed systems are negative.  ESS: 2 FSS: 19  ALLERGIES: Allergies  Allergen Reactions   Aspirin     Can not take Aspirin and Sulfa in combination   Sulfa Antibiotics Hives    HOME MEDICATIONS: Outpatient Medications Prior to Visit  Medication Sig Dispense Refill   acetaminophen (TYLENOL) 500 MG tablet Take 500 mg by mouth every 6 (six) hours as needed for mild pain or moderate pain.     cetirizine (ZYRTEC) 10 MG tablet      cholecalciferol (VITAMIN D) 1000 UNITS tablet Take 2,000 Units by mouth daily.      dorzolamide-timolol (COSOPT) 22.3-6.8 MG/ML ophthalmic solution INSTILL 1 DROP INTO RIGHT EYE TWICE A DAY     estradiol (ESTRACE) 0.5 MG tablet TAKE 1 TABLET (0.5 MG TOTAL) BY MOUTH DAILY. 90 tablet 4   fish oil-omega-3 fatty acids 1000 MG capsule Take 2 g by mouth daily.     GARLIC PO Take by mouth daily in the afternoon.     hydrochlorothiazide (HYDRODIURIL) 12.5 MG tablet Take by mouth daily.     Magnesium 250 MG TABS Take by mouth.     vitamin E 1000 UNIT capsule Take 1,000 Units by mouth daily.     aspirin EC  81 MG tablet Take 81 mg by mouth daily. Swallow whole. (Patient not taking: Reported on 07/03/2023)     atorvastatin (LIPITOR) 80 MG tablet Take 1 tablet (80 mg total) by mouth daily. (Patient not taking: Reported on 01/05/2022) 90 tablet 3   Brimonidine Tartrate-Timolol (COMBIGAN OP) Apply to eye. (Patient not taking: Reported on 07/03/2023)     nitroGLYCERIN (NITROSTAT) 0.4 MG SL tablet Place 1 tablet (0.4 mg  total) under the tongue every 5 (five) minutes as needed for up to 30 doses for chest pain. Up to 2 doses in a row (Patient not taking: Reported on 01/05/2022) 30 tablet 0   Facility-Administered Medications Prior to Visit  Medication Dose Route Frequency Provider Last Rate Last Admin   regadenoson (LEXISCAN) injection SOLN 0.4 mg  0.4 mg Intravenous Once Wendall Stade, MD        PAST MEDICAL HISTORY: Past Medical History:  Diagnosis Date   Arthritis    Blood transfusion without reported diagnosis    Cataract    Diverticulitis    Fibroid    GERD (gastroesophageal reflux disease)    occ   Glaucoma    Heart murmur    History of echocardiogram 10/16/2011   EF >55%; borderline concentric LVH; mild MR, mild TR   History of palpitations    Hyperlipidemia    Hypertension    OSA on CPAP     PAST SURGICAL HISTORY: Past Surgical History:  Procedure Laterality Date   ABDOMINAL HYSTERECTOMY  1991   TAH,BSO   BREAST SURGERY     REDUCTION MAMMAPLASTY   EYE SURGERY  2010   EYE SURGERY     shunt due to glaucoma   HERNIA REPAIR     INSERTION OF AHMED VALVE Left 05/04/2015   Procedure: REVISION AHMED VALVE LEFT EYE WITH TUTOPLAST AND AMNIOGRAFT;  Surgeon: Chalmers Guest, MD;  Location: [facility];  Service: Ophthalmology;  Laterality: Left;   JOINT REPLACEMENT     OOPHORECTOMY     BSO   REPLACEMENT TOTAL KNEE Right 2008   TONSILLECTOMY      FAMILY HISTORY: Family History  Problem Relation Age of Onset   Cancer Mother        Bladder cancer   Hypertension Brother    COPD Brother     Hypertension Sister    Breast cancer Sister        Age 57   Cancer Sister        Bladder cancer   Stroke Maternal Grandmother    Colon cancer Neg Hx     SOCIAL HISTORY: Social History   Socioeconomic History   Marital status: Widowed    Spouse name: Not on file   Number of children: Not on file   Years of education: Not on file   Highest education level: Not on file  Occupational History   Not on file  Tobacco Use   Smoking status: Former    Current packs/day: 0.00    Types: Cigarettes    Quit date: 11/26/1980    Years since quitting: 42.6   Smokeless tobacco: Never  Vaping Use   Vaping status: Never Used  Substance and Sexual Activity   Alcohol use: No    Alcohol/week: 0.0 standard drinks of alcohol   Drug use: No   Sexual activity: Not Currently    Birth control/protection: Surgical    Comment: 1st intercourse 72 yo-Fewer than 5 partners  Other Topics Concern   Not on file  Social History Narrative   Not on file   Social Determinants of Health   Financial Resource Strain: Not on file  Food Insecurity: Not on file  Transportation Needs: Not on file  Physical Activity: Not on file  Stress: Not on file  Social Connections: Not on file  Intimate Partner Violence: Not on file      PHYSICAL EXAM  Vitals:   07/03/23 0846  BP: (!) 152/77  Pulse: 68  Weight: 216 lb 8 oz (98.2 kg)  Height: 5\' 3"  (1.6 m)   Body mass index is 38.35 kg/m.  Generalized: Well developed, in no acute distress  Cardiology: normal rate and rhythm, no murmur noted Respiratory: clear to auscultation bilaterally  Neurological examination  Mentation: Alert oriented to time, place, history taking. Follows all commands speech and language fluent Cranial nerve II-XII: Pupils were equal round reactive to light. Extraocular movements were full, visual field were full  Motor: The motor testing reveals 5 over 5 strength of all 4 extremities. Good symmetric motor tone is noted throughout.   Gait and station: Gait is normal.    DIAGNOSTIC DATA (LABS, IMAGING, TESTING) - I reviewed patient records, labs, notes, testing and imaging myself where available.      No data to display           Lab Results  Component Value Date   WBC 5.5 04/19/2021   HGB 12.8 04/19/2021   HCT 40.1 04/19/2021   MCV 89.3 04/19/2021   PLT 176 04/19/2021      Component Value Date/Time   NA 139 04/19/2021 1236   K 3.8 04/19/2021 1236   CL 102 04/19/2021 1236   CO2 30 04/19/2021 1236   GLUCOSE 102 (H) 04/19/2021 1236   BUN 12 04/19/2021 1236   CREATININE 1.03 (H) 04/19/2021 1236   CALCIUM 9.5 04/19/2021 1236   PROT 7.6 05/05/2013 1603   ALBUMIN 3.9 05/05/2013 1603   AST 22 05/05/2013 1603   ALT 18 05/05/2013 1603   ALKPHOS 100 05/05/2013 1603   BILITOT 0.9 05/05/2013 1603   GFRNONAA 59 (L) 04/19/2021 1236   GFRAA >60 05/04/2015 0735   Lab Results  Component Value Date   CHOL 250 (H) 05/05/2013   HDL 41.50 05/05/2013   LDLDIRECT 197.1 05/05/2013   TRIG 100.0 05/05/2013   CHOLHDL 6 05/05/2013   No results found for: "HGBA1C" No results found for: "VITAMINB12" Lab Results  Component Value Date   TSH 1.11 05/05/2013      ASSESSMENT AND PLAN 72 y.o. year old female  has a past medical history of Arthritis, Blood transfusion without reported diagnosis, Cataract, Diverticulitis, Fibroid, GERD (gastroesophageal reflux disease), Glaucoma, Heart murmur, History of echocardiogram (10/16/2011), History of palpitations, Hyperlipidemia, Hypertension, and OSA on CPAP. here with     ICD-10-CM   1. OSA on CPAP  G47.33 For home use only DME continuous positive airway pressure (CPAP)       Ms. Delphia is doing very well with CPAP therapy.  Compliance report reveals excellent compliance. She was encouraged to continue using CPAP nightly and for greater than 4 hours each night.  She will continue healthy lifestyle habits.  We will update supply orders today.  She will follow-up with me  in 1 year, sooner if needed.  She verbalizes understanding and agreement with this plan.   Orders Placed This Encounter  Procedures   For home use only DME continuous positive airway pressure (CPAP)    Supplies    Order Specific Question:   Length of Need    Answer:   Lifetime    Order Specific Question:   Patient has OSA or probable OSA    Answer:   Yes    Order Specific Question:   Is the patient currently using CPAP in the home    Answer:   Yes    Order Specific Question:   Settings    Answer:   Other see comments    Order  Specific Question:   CPAP supplies needed    Answer:   Mask, headgear, cushions, filters, heated tubing and water chamber     No orders of the defined types were placed in this encounter.     Shawnie Dapper, FNP-C 07/03/2023, 9:16 AM [facility] [address] [place], [place] [address] [phone]

## 2023-07-09 DIAGNOSIS — I129 Hypertensive chronic kidney disease with stage 1 through stage 4 chronic kidney disease, or unspecified chronic kidney disease: Secondary | ICD-10-CM | POA: Diagnosis not present

## 2023-07-09 DIAGNOSIS — R55 Syncope and collapse: Secondary | ICD-10-CM | POA: Diagnosis not present

## 2023-07-09 DIAGNOSIS — E7801 Familial hypercholesterolemia: Secondary | ICD-10-CM | POA: Diagnosis not present

## 2023-07-09 DIAGNOSIS — E7800 Pure hypercholesterolemia, unspecified: Secondary | ICD-10-CM | POA: Diagnosis not present

## 2023-07-10 ENCOUNTER — Other Ambulatory Visit: Payer: Self-pay | Admitting: Family Medicine

## 2023-07-10 DIAGNOSIS — R55 Syncope and collapse: Secondary | ICD-10-CM

## 2023-07-17 ENCOUNTER — Encounter: Payer: Medicare PPO | Admitting: Gastroenterology

## 2023-07-20 DIAGNOSIS — Z1231 Encounter for screening mammogram for malignant neoplasm of breast: Secondary | ICD-10-CM | POA: Diagnosis not present

## 2023-07-23 DIAGNOSIS — H401133 Primary open-angle glaucoma, bilateral, severe stage: Secondary | ICD-10-CM | POA: Diagnosis not present

## 2023-07-24 ENCOUNTER — Telehealth: Payer: Self-pay

## 2023-07-24 ENCOUNTER — Other Ambulatory Visit: Payer: Self-pay | Admitting: Family Medicine

## 2023-07-24 DIAGNOSIS — I129 Hypertensive chronic kidney disease with stage 1 through stage 4 chronic kidney disease, or unspecified chronic kidney disease: Secondary | ICD-10-CM | POA: Diagnosis not present

## 2023-07-24 DIAGNOSIS — R55 Syncope and collapse: Secondary | ICD-10-CM

## 2023-07-24 DIAGNOSIS — I10 Essential (primary) hypertension: Secondary | ICD-10-CM | POA: Diagnosis not present

## 2023-07-24 NOTE — Patient Instructions (Signed)
Visit Information  Thank you for taking time to visit with me today. Please don't hesitate to contact me if I can be of assistance to you.   Following are the goals we discussed today:   Goals Addressed             This Visit's Progress    COMPLETED: Care Coordination Activities-No follow up required       Care Coordination Interventions: Discussed [facility] services and support. Assessed SDOH. Advised to discuss with primary care physician if services needed in the future.          If you are experiencing a Mental Health or Behavioral Health Crisis or need someone to talk to, please call the Suicide and Crisis Lifeline: 988   Patient verbalizes understanding of instructions and care plan provided today and agrees to view in MyChart. Active MyChart status and patient understanding of how to access instructions and care plan via MyChart confirmed with patient.     The patient has been provided with contact information for the care management team and has been advised to call with any health related questions or concerns.   Bary Leriche, RN, MSN [facility], [facility] Management Community Coordinator Direct Dial: [phone]  Fax: [phone] Website: [email or web address]

## 2023-07-24 NOTE — Patient Outreach (Signed)
  Care Coordination   In Person Provider Office Visit Note   07/24/2023 Name: [name] MRN: [id number] DOB: 14-Jul-1951  [name] is a 72 y.o. year old female who sees Irena Reichmann, [place] for primary care. I engaged with [name] in the providers office today.  What matters to the patients health and wellness today?  none    Goals Addressed             This Visit's Progress    COMPLETED: Care Coordination Activities-No follow up required       Care Coordination Interventions: Discussed [facility] services and support. Assessed SDOH. Advised to discuss with primary care physician if services needed in the future.        SDOH assessments and interventions completed:  Yes  SDOH Interventions Today    Flowsheet Row Most Recent Value  SDOH Interventions   Transportation Interventions Intervention Not Indicated        Care Coordination Interventions:  Yes, provided   Follow up plan: No further intervention required.   Encounter Outcome:  Pt. Visit Completed   [name] Idelle Jo, RN, MSN [facility]  [facility], [facility] Management Community Coordinator Direct Dial: [phone]  Fax: [phone] Website: [email or web address]

## 2023-07-25 ENCOUNTER — Ambulatory Visit (HOSPITAL_COMMUNITY)
Admission: RE | Admit: 2023-07-25 | Discharge: 2023-07-25 | Disposition: A | Discharge status: Home / Self Care | Payer: Medicare PPO | Source: Ambulatory Visit | Attending: Family Medicine | Admitting: Family Medicine

## 2023-07-25 DIAGNOSIS — R55 Syncope and collapse: Secondary | ICD-10-CM | POA: Diagnosis not present

## 2023-07-25 MED ORDER — GADOBUTROL 1 MMOL/ML IV SOLN
10.0000 mL | Freq: Once | INTRAVENOUS | Status: AC | PRN
Start: 2023-07-25 — End: 2023-07-25
  Administered 2023-07-25: 10 mL via INTRAVENOUS

## 2023-07-31 ENCOUNTER — Ambulatory Visit: Payer: Medicare PPO | Admitting: Neurology

## 2023-07-31 ENCOUNTER — Encounter: Payer: Self-pay | Admitting: Neurology

## 2023-07-31 VITALS — BP 154/86 | HR 64 | Ht 63.0 in | Wt 216.0 lb

## 2023-07-31 DIAGNOSIS — R404 Transient alteration of awareness: Secondary | ICD-10-CM

## 2023-07-31 NOTE — Patient Instructions (Signed)
Near-Syncope Near-syncope is when you suddenly feel like you might pass out or faint. This may also be called presyncope. During an episode of near-syncope, you may: Feel dizzy, weak, or light-headed. It may feel like the room is spinning. Feel like you may vomit (nauseous). See spots or see all white or all black. Have cold, clammy skin. Feel warm and sweaty. Hear ringing in your ears. This condition is caused by a sudden decrease in blood flow to the brain. This can result from many causes, but most of those causes are not dangerous. However, near-syncope may be a sign of a serious medical problem, so it is important to seek medical care. Follow these instructions at home: Medicines Take over-the-counter and prescription medicines only as told by your doctor. If you are taking blood pressure or heart medicine, get up slowly and spend many minutes getting ready to sit and then stand. This can help with dizziness. Lifestyle Do not drive, use machinery, or play sports until your doctor says it is okay. Do not drink alcohol. Do not smoke or use any products that contain nicotine or tobacco. If you need help quitting, ask your doctor. Avoid hot tubs and saunas. General instructions Be aware of any changes in your symptoms. Talk with your doctor about your symptoms. You may need to have testing to help find the cause. If you start to feel like you might pass out, sit or lie down right away. If sitting, lower your head down between your legs. If lying down, raise (elevate) your feet above the level of your heart. Breathe deeply and steadily. Wait until all of the symptoms are gone. Have someone stay with you until you feel better. Drink enough fluid to keep your pee (urine) pale yellow. Avoid standing for a long time. If you must stand for a long time, do movements such as: Moving your legs. Crossing your legs. Flexing and stretching your leg muscles. Squatting. Keep all follow-up  visits. Contact a doctor if: You continue to have episodes of near fainting. Get help right away if: You pass out or faint. You have any of these symptoms: Fast or uneven heartbeats (palpitations). Pain in your chest, belly, or back. Shortness of breath. You have a seizure. You have a very bad headache. You are confused. You have trouble seeing. You are very weak. You have trouble walking. You are bleeding from your mouth or butt. You have black or tarry poop (stool). These symptoms may be an emergency. Get help right away. Call your local emergency services (911 in the [place] Do not wait to see if the symptoms will go away. Do not drive yourself to the hospital. Summary Near-syncope is when you suddenly feel like you might pass out or faint. This condition is caused by a sudden decrease in blood flow to the brain. Near-syncope may be a sign of a serious medical problem, so it is important to seek medical care. If you start to feel like you might pass out, sit or lie down right away. If sitting, lower your head down between your legs. If lying down, raise (elevate) your feet above the level of your heart. Talk with your doctor about your symptoms. You may need to have testing to help find the cause. This information is not intended to replace advice given to you by your health care provider. Make sure you discuss any questions you have with your health care provider. Document Revised: 03/23/2021 Document Reviewed: 03/23/2021 Elsevier Patient Education  2024  [employer] Electroencephalogram, Adult An electroencephalogram (EEG) is a test that records electrical activity in the brain. It is often used to diagnose or monitor problems that are related to the brain, such as: Seizure disorders. Fainting spells. Sleep problems. Head injuries. Changes in behavior. Tell a health care provider about: Any allergies you have. All medicines you are taking, including vitamins, herbs, eye  drops, creams, and over-the-counter medicines. Any medical conditions you have or have had, including psychiatric conditions. Any surgeries you have had. Any history of heavy drug or alcohol use. What are the risks? Generally, this is a safe test. If you have a seizure disorder, you may be made to have a seizure during the test. This is done so that your brain activity can be recorded during the seizure. What happens before the procedure?  Arrive with your hair clean, dry, and not styled. Do not comb your hair toward your scalp to add volume (backcomb). Do not put hair spray, oil, or other hair products in your hair. Do not have caffeine within 4 hours of having your test. Follow instructions from your health care provider about how much sleep to get before the test. If you are asked to limit how much you sleep, you may need to have a responsible adult bring you to the test and take you home from the test. Ask your health care provider about taking your regular and over-the-counter medicines, herbs, and supplements. What happens during the procedure? You will be asked to sit in a chair or lie down. Many small metal discs (electrodes) will be carefully attached to your head with an adhesive. It may take some time to get all the electrodes on the right spots for the test. These electrodes will pick up on the signals in your brain, and a machine will record the signals. During the test, you may be asked to: Sit or lie quietly and relax. Open and close your eyes. Breathe deeply and quickly (hyperventilate) for 3 minutes or longer. Look at a flashing light for a short amount of time. Read text or look at an image. Go to sleep. When the test is complete, the electrodes will be removed by using a solution such as acetone or fingernail polish remover. What can I expect after the test? After your test, it is common to have no after-effects from the test. You should be able to return to your normal  daily activities right away. Keep in mind: You may need to bathe after the test to remove the adhesive used to attach the electrodes. The adhesive is easily washed away. If a seizure happened during your EEG, you may need further medical help depending on the type of seizure and how severe it was. It is up to you to get the results of your procedure. Ask your health care provider, or the department that is doing the procedure, when your results will be ready. Summary An electroencephalogram (EEG) is a test that is often used to diagnose or monitor problems that are related to the brain. Do not have caffeine within 4 hours of having your test. Follow other instructions from your health care provider about sleep and medicines before the test. During the procedure, small metal discs (electrodes) will be attached to your head with an adhesive. You may be asked to sit or lie quietly and relax during the test. You may also be asked to do other activities during the test, such as watch flashing lights or breathe quickly. This information  is not intended to replace advice given to you by your health care provider. Make sure you discuss any questions you have with your health care provider. Document Revised: 07/26/2021 Document Reviewed: 04/28/2020 Elsevier Patient Education  2024 [employer].
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
## 2023-08-13 ENCOUNTER — Ambulatory Visit: Payer: Medicare PPO | Admitting: Neurology

## 2023-08-13 ENCOUNTER — Telehealth: Payer: Self-pay | Admitting: Neurology

## 2023-08-13 ENCOUNTER — Other Ambulatory Visit: Payer: Medicare PPO

## 2023-08-13 DIAGNOSIS — G4730 Sleep apnea, unspecified: Secondary | ICD-10-CM

## 2023-08-13 DIAGNOSIS — G4733 Obstructive sleep apnea (adult) (pediatric): Secondary | ICD-10-CM

## 2023-08-13 DIAGNOSIS — R404 Transient alteration of awareness: Secondary | ICD-10-CM

## 2023-08-13 DIAGNOSIS — R4182 Altered mental status, unspecified: Secondary | ICD-10-CM | POA: Diagnosis not present

## 2023-08-13 DIAGNOSIS — I10 Essential (primary) hypertension: Secondary | ICD-10-CM

## 2023-08-13 NOTE — Telephone Encounter (Signed)
Per note from Dr Vickey Huger, "NOT working with machinery, with sharp knifes, saws, in great heights or with open flames.  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  She  still needs additional cardiac monitoring and has to control her BP better."  Once Dr Vickey Huger reviewd the results from the EEG completed today, we will write a note.

## 2023-08-13 NOTE — Telephone Encounter (Signed)
Pt is requesting a return to work note after she receives EEG results if okay with Dr. Vickey Huger

## 2023-08-19 ENCOUNTER — Encounter: Payer: Self-pay | Admitting: Neurology

## 2023-08-22 ENCOUNTER — Telehealth: Payer: Self-pay

## 2023-08-22 DIAGNOSIS — Z0289 Encounter for other administrative examinations: Secondary | ICD-10-CM

## 2023-08-22 NOTE — Telephone Encounter (Signed)
Received FMLA form from [facility]. Reached out to patient regarding form fee. Form fee paid. Placed in POD 3 for completion. Patient would like to call to advise once completed.

## 2023-08-28 ENCOUNTER — Encounter: Payer: Self-pay | Admitting: Neurology

## 2023-08-28 ENCOUNTER — Telehealth: Payer: Medicare PPO | Admitting: Neurology

## 2023-08-28 DIAGNOSIS — G4730 Sleep apnea, unspecified: Secondary | ICD-10-CM

## 2023-08-28 DIAGNOSIS — I10 Essential (primary) hypertension: Secondary | ICD-10-CM | POA: Diagnosis not present

## 2023-08-28 DIAGNOSIS — I129 Hypertensive chronic kidney disease with stage 1 through stage 4 chronic kidney disease, or unspecified chronic kidney disease: Secondary | ICD-10-CM

## 2023-08-28 DIAGNOSIS — G4733 Obstructive sleep apnea (adult) (pediatric): Secondary | ICD-10-CM | POA: Diagnosis not present

## 2023-08-28 NOTE — Procedures (Signed)
9-17-2024[facility]  EEG (ELECTROENCEPHALOGRAM) REPORT     ORDERING CLINICIAN: Melvyn Novas, M.D.  TECHNOLOGIST: Marianne Sofia REEGT TECHNIQUE:  This Electroencephalogram was recorded utilizing the international standard 10-20 system of lead placement and reformatted into average and bipolar montages.  A single ECG electrode is placed to detect heart rate and rhythm. While a camera is directed at the patient during EEG , and a video screen is parallel to the EEG observed by the attending technologist, the video and audio can not be recorded.    Activation included:  yes, Photic stimulation and yes, Hyperventilation .   Description: The EEG's posterior dominant background rhythm of 8 hertz was symmetrically displayed while the patient's eyes were closed , and promptly attenuated with eye opening. At baseline, the recording showed a moderately high  amplitude in symmetric fashion.  Hyperventilation maneuver was initiated but was not  leading to amplitude build-up, also not leading to slowing.  Following hyperventilation the patient's EEG was reviewed for a period of 1 and 2 minutes post maneuver, and indicated  relaxation without vertex sharp waves or sleep spindles.   Photic stimulation was initiated at frequencies from 3- through 21 hertz, resulting in photic entrainment at  5 through 21 hz , without  eliciting any periodic or rhythmic discharges or epileptiform activity.  EKG showed NSR with frequency between 52 and 61 bpm .  The patient was recorded while awake, while drowsy.   IMPRESSION:  This EEG is normal.    Dr. Melvyn Novas, M.D. Accredited by the ABPN, ABSM.
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
## 2023-08-29 NOTE — Telephone Encounter (Signed)
Form completed and signed and placed on [name]'s desk in medical records. (It appears a page 2 may have been missing) I will check with medical records

## 2023-09-04 ENCOUNTER — Telehealth: Payer: Self-pay | Admitting: *Deleted

## 2023-09-04 ENCOUNTER — Ambulatory Visit: Payer: Medicare PPO | Attending: Physician Assistant | Admitting: Physician Assistant

## 2023-09-04 ENCOUNTER — Encounter: Payer: Self-pay | Admitting: Physician Assistant

## 2023-09-04 VITALS — BP 140/84 | HR 74 | Ht 63.0 in | Wt 216.0 lb

## 2023-09-04 DIAGNOSIS — I10 Essential (primary) hypertension: Secondary | ICD-10-CM | POA: Diagnosis not present

## 2023-09-04 DIAGNOSIS — R55 Syncope and collapse: Secondary | ICD-10-CM | POA: Diagnosis not present

## 2023-09-04 DIAGNOSIS — G4733 Obstructive sleep apnea (adult) (pediatric): Secondary | ICD-10-CM | POA: Diagnosis not present

## 2023-09-04 DIAGNOSIS — R079 Chest pain, unspecified: Secondary | ICD-10-CM

## 2023-09-04 NOTE — Patient Instructions (Signed)
Medication Instructions:  NO CHANGES *If you need a refill on your cardiac medications before your next appointment, please call your pharmacy*   Lab Work: NO LABS If you have labs (blood work) drawn today and your tests are completely normal, you will receive your results only by: MyChart Message (if you have MyChart) OR A paper copy in the mail If you have any lab test that is abnormal or we need to change your treatment, we will call you to review the results.   Testing/Procedures: NO TESTING   Follow-Up: At [facility], you and your health needs are our priority.  As part of our continuing mission to provide you with exceptional heart care, we have created designated Provider Care Teams.  These Care Teams include your primary Cardiologist (physician) and Advanced Practice Providers (APPs -  Physician Assistants and Nurse Practitioners) who all work together to provide you with the care you need, when you need it.   Your next appointment:   1 year(s)  Provider:   Zoila Shutter MD

## 2023-09-04 NOTE — Telephone Encounter (Signed)
Pt [employer] form faxed
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
## 2023-09-06 ENCOUNTER — Telehealth: Payer: Self-pay | Admitting: Neurology

## 2023-09-06 ENCOUNTER — Telehealth: Payer: Self-pay | Admitting: Physician Assistant

## 2023-09-06 NOTE — Telephone Encounter (Signed)
I discussed with Dr. Rennis Golden regarding Mrs. [name]'s previous syncope. Dr. Rennis Golden recommended a echocardiogram and 2 week ZIO XT monitor to assess the syncope. Please arrange. Also arrange a follow up with Dr. Rennis Golden MD only in 3-5 month to review the results.   Thank you

## 2023-09-06 NOTE — Telephone Encounter (Signed)
Pt said employer needs letter to say return to work with no restrictions. Can fax to [phone]. Would like a call back to confirm letter has been faxed.

## 2023-09-09 ENCOUNTER — Encounter: Payer: Self-pay | Admitting: Neurology

## 2023-09-09 DIAGNOSIS — M1712 Unilateral primary osteoarthritis, left knee: Secondary | ICD-10-CM | POA: Diagnosis not present

## 2023-09-09 NOTE — Telephone Encounter (Signed)
Letter has been written and faxed to the number provided. Will also send a message on mychart

## 2023-09-09 NOTE — Telephone Encounter (Signed)
Left message to call back  

## 2023-09-10 ENCOUNTER — Telehealth: Payer: Self-pay | Admitting: Internal Medicine

## 2023-09-10 ENCOUNTER — Ambulatory Visit: Payer: Medicare PPO | Attending: Internal Medicine

## 2023-09-10 DIAGNOSIS — R55 Syncope and collapse: Secondary | ICD-10-CM

## 2023-09-10 NOTE — Telephone Encounter (Signed)
Went over the information above- informed that the 2 week monitor will come in the mail- do not get it wet within the first 24 hours. Instructions included, if you have any question, please call.  Informed that someone will call to schedule the Echo. She verbalized understanding. Follow up appt scheduled

## 2023-09-10 NOTE — Telephone Encounter (Signed)
Pt returning call

## 2023-09-10 NOTE — Progress Notes (Unsigned)
Enrolled patient for a 14 day Zio XT  monitor to be mailed to patients home  °

## 2023-09-10 NOTE — Telephone Encounter (Signed)
I discussed with Dr. Rennis Golden regarding [name]'s previous syncope. Dr. Rennis Golden recommended a echocardiogram and 2 week ZIO XT monitor to assess the syncope. Please arrange. Also arrange a follow up with Dr. Rennis Golden MD only in 3-5 month to review the results.    Thank you      Left message with call back number on pt's cell phone and on her Nephew's phone. On pt's home phone- left message stating that Dr Rennis Golden wants to get an Echocardiogram and for her to wear a monitor due to episodes of syncope. Asked her to callback.  (Will need to put in orders and schedule follow up appt with Dr Rennis Golden 3-5 months)

## 2023-09-10 NOTE — Telephone Encounter (Signed)
Left voicemail to return call to office.

## 2023-09-10 NOTE — Telephone Encounter (Signed)
Patient was returning phone call 

## 2023-09-10 NOTE — Addendum Note (Signed)
Addended by: Scheryl Marten on: 09/10/2023 02:57 PM   Modules accepted: Orders

## 2023-09-12 NOTE — Telephone Encounter (Signed)
Addressed in another phone note encounter. Echo scheduled 10/01/23

## 2023-09-15 DIAGNOSIS — R55 Syncope and collapse: Secondary | ICD-10-CM

## 2023-10-01 ENCOUNTER — Ambulatory Visit (HOSPITAL_COMMUNITY): Payer: Medicare PPO

## 2023-10-03 DIAGNOSIS — R55 Syncope and collapse: Secondary | ICD-10-CM | POA: Diagnosis not present

## 2023-10-11 DIAGNOSIS — M1712 Unilateral primary osteoarthritis, left knee: Secondary | ICD-10-CM | POA: Diagnosis not present

## 2023-10-22 ENCOUNTER — Other Ambulatory Visit (HOSPITAL_COMMUNITY): Payer: Medicare PPO

## 2023-11-21 ENCOUNTER — Ambulatory Visit (HOSPITAL_COMMUNITY): Payer: Medicare PPO | Attending: Internal Medicine

## 2023-11-21 DIAGNOSIS — R55 Syncope and collapse: Secondary | ICD-10-CM | POA: Diagnosis not present

## 2023-11-22 LAB — ECHOCARDIOGRAM COMPLETE
Calc EF: 60.5 %
S' Lateral: 2.32 cm
Single Plane A2C EF: 59.9 %
Single Plane A4C EF: 60.8 %

## 2023-11-26 DIAGNOSIS — J209 Acute bronchitis, unspecified: Secondary | ICD-10-CM | POA: Diagnosis not present

## 2023-11-26 DIAGNOSIS — I10 Essential (primary) hypertension: Secondary | ICD-10-CM | POA: Diagnosis not present

## 2023-11-28 ENCOUNTER — Encounter (HOSPITAL_BASED_OUTPATIENT_CLINIC_OR_DEPARTMENT_OTHER): Payer: Self-pay | Admitting: Emergency Medicine

## 2023-11-28 ENCOUNTER — Other Ambulatory Visit: Payer: Self-pay

## 2023-11-28 ENCOUNTER — Emergency Department (HOSPITAL_BASED_OUTPATIENT_CLINIC_OR_DEPARTMENT_OTHER): Payer: Medicare PPO | Admitting: Radiology

## 2023-11-28 ENCOUNTER — Emergency Department (HOSPITAL_BASED_OUTPATIENT_CLINIC_OR_DEPARTMENT_OTHER)
Admission: EM | Admit: 2023-11-28 | Discharge: 2023-11-29 | Disposition: A | Discharge status: Home / Self Care | Payer: Medicare PPO | Attending: Emergency Medicine | Admitting: Emergency Medicine

## 2023-11-28 DIAGNOSIS — I129 Hypertensive chronic kidney disease with stage 1 through stage 4 chronic kidney disease, or unspecified chronic kidney disease: Secondary | ICD-10-CM | POA: Insufficient documentation

## 2023-11-28 DIAGNOSIS — J181 Lobar pneumonia, unspecified organism: Secondary | ICD-10-CM | POA: Insufficient documentation

## 2023-11-28 DIAGNOSIS — Z20822 Contact with and (suspected) exposure to covid-19: Secondary | ICD-10-CM | POA: Diagnosis not present

## 2023-11-28 DIAGNOSIS — B974 Respiratory syncytial virus as the cause of diseases classified elsewhere: Secondary | ICD-10-CM | POA: Insufficient documentation

## 2023-11-28 DIAGNOSIS — Z79899 Other long term (current) drug therapy: Secondary | ICD-10-CM | POA: Diagnosis not present

## 2023-11-28 DIAGNOSIS — J210 Acute bronchiolitis due to respiratory syncytial virus: Secondary | ICD-10-CM | POA: Diagnosis not present

## 2023-11-28 DIAGNOSIS — R918 Other nonspecific abnormal finding of lung field: Secondary | ICD-10-CM | POA: Diagnosis not present

## 2023-11-28 DIAGNOSIS — J189 Pneumonia, unspecified organism: Secondary | ICD-10-CM | POA: Diagnosis not present

## 2023-11-28 DIAGNOSIS — N1831 Chronic kidney disease, stage 3a: Secondary | ICD-10-CM | POA: Diagnosis not present

## 2023-11-28 DIAGNOSIS — J168 Pneumonia due to other specified infectious organisms: Secondary | ICD-10-CM | POA: Diagnosis not present

## 2023-11-28 DIAGNOSIS — I10 Essential (primary) hypertension: Secondary | ICD-10-CM

## 2023-11-28 DIAGNOSIS — R059 Cough, unspecified: Secondary | ICD-10-CM | POA: Diagnosis not present

## 2023-11-28 DIAGNOSIS — R0989 Other specified symptoms and signs involving the circulatory and respiratory systems: Secondary | ICD-10-CM | POA: Diagnosis not present

## 2023-11-28 LAB — CBC
HCT: 44.2 % (ref 36.0–46.0)
Hemoglobin: 14.5 g/dL (ref 12.0–15.0)
MCH: 29.8 pg (ref 26.0–34.0)
MCHC: 32.8 g/dL (ref 30.0–36.0)
MCV: 90.9 fL (ref 80.0–100.0)
Platelets: 208 10*3/uL (ref 150–400)
RBC: 4.86 MIL/uL (ref 3.87–5.11)
RDW: 12.3 % (ref 11.5–15.5)
WBC: 5.8 10*3/uL (ref 4.0–10.5)
nRBC: 0 % (ref 0.0–0.2)

## 2023-11-28 LAB — BASIC METABOLIC PANEL
Anion gap: 10 (ref 5–15)
BUN: 17 mg/dL (ref 8–23)
CO2: 28 mmol/L (ref 22–32)
Calcium: 10.2 mg/dL (ref 8.9–10.3)
Chloride: 101 mmol/L (ref 98–111)
Creatinine, Ser: 1.14 mg/dL — ABNORMAL HIGH (ref 0.44–1.00)
GFR, Estimated: 51 mL/min — ABNORMAL LOW (ref >60)
Glucose, Bld: 94 mg/dL (ref 70–99)
Potassium: 4.1 mmol/L (ref 3.5–5.1)
Sodium: 139 mmol/L (ref 135–145)

## 2023-11-28 LAB — RESP PANEL BY RT-PCR (RSV, FLU A&B, COVID)  RVPGX2
Influenza A by PCR: NEGATIVE
Influenza B by PCR: NEGATIVE
Resp Syncytial Virus by PCR: POSITIVE — AB
SARS Coronavirus 2 by RT PCR: NEGATIVE

## 2023-11-28 MED ORDER — HYDRALAZINE HCL 20 MG/ML IJ SOLN
10.0000 mg | Freq: Once | INTRAMUSCULAR | Status: AC
  Administered 2023-11-28: 10 mg via INTRAVENOUS
  Filled 2023-11-28: qty 1

## 2023-11-28 MED ORDER — AZITHROMYCIN 250 MG PO TABS: 250.0000 mg | ORAL_TABLET | Freq: Every day | ORAL | 0 refills | Status: DC

## 2023-11-28 MED ORDER — SODIUM CHLORIDE 0.9 % IV SOLN
1.0000 g | Freq: Once | INTRAVENOUS | Status: AC
  Administered 2023-11-28: 1 g via INTRAVENOUS
  Filled 2023-11-28: qty 10

## 2023-11-28 MED ORDER — AMOXICILLIN-POT CLAVULANATE 875-125 MG PO TABS: 1.0000 | ORAL_TABLET | Freq: Two times a day (BID) | ORAL | 0 refills | Status: DC

## 2023-11-28 MED ORDER — SODIUM CHLORIDE 0.9 % IV SOLN
500.0000 mg | Freq: Once | INTRAVENOUS | Status: AC
  Administered 2023-11-28: 500 mg via INTRAVENOUS
  Filled 2023-11-28: qty 5

## 2023-11-28 MED ORDER — HYDRALAZINE HCL 10 MG PO TABS: 10.0000 mg | ORAL_TABLET | Freq: Three times a day (TID) | ORAL | 0 refills | Status: DC | PRN

## 2023-11-28 NOTE — ED Triage Notes (Signed)
 C/o cough and congestion x 1 week.Seen at [facility] and dx w/ bronchitis. Given steroid injection but no relief. HTN in triage.

## 2023-11-28 NOTE — Discharge Instructions (Addendum)
 As discussed, x-ray did not show evidence of pneumonia.  You also did test positive for RSV.  Will treat pneumonia with 2 different antibiotics in the outpatient setting in the form of Augmentin  as well as azithromycin .  Please take as directed.  Recommend avoiding any over-the-counter cough and cold medicine with dextromethorphan or pseudoephedrine and as they can raise blood pressure.  If you desired to take a decongestion, you can take Coricidin which is blood pressure friendly.  Will send a medication called hydralazine  to take as needed if her blood pressure is greater than 165 systolic.  Recommend follow-up with your primary care for reassessment of your symptoms.  Please do not hesitate to return if the worrisome signs and symptoms we discussed become apparent.

## 2023-11-28 NOTE — ED Provider Notes (Signed)
 [place] [facility] AT [facility] Provider Note   CSN: [id number] Arrival date & time: 11/28/23  1603     History  Chief Complaint  Patient presents with   Cough    [name] is a 73 y.o. female.   Cough    73 year old female presents emergency department with 2 weeks of cough, congestion.  States she has seen both her primary care and urgent care for similar symptoms.  States she she was given amoxicillin  by her primary care in the outpatient setting last week of which she took in its entirety without significant improvement of symptoms.  Was seen at urgent care on Wednesday and given IM injection of steroid without cannot improvement of symptoms.  Patient states that she has been trying multiple over-the-counter cough and cold medicines with some relief of symptoms.  Patient also states that her blood pressure has been fluctuating pretty significantly from around 120s to the 180s or 190s as she has been trying to treat her symptoms with over-the-counter medications.  Denies any chest pain, fever, chills, shortness of breath, abdominal pain, nausea, vomiting.  Past medical history significant for hypertension, GERD, hyperlipidemia, OSA on CPAP, obesity, CKD 3A  Home Medications Prior to Admission medications   Medication Sig Start Date End Date Taking? Authorizing Provider  amoxicillin -clavulanate (AUGMENTIN ) 875-125 MG tablet Take 1 tablet by mouth every 12 (twelve) hours. 11/29/23  Yes Silver Fell A, PA  azithromycin  (ZITHROMAX ) 250 MG tablet Take 1 tablet (250 mg total) by mouth daily. Take first 2 tablets together, then 1 every day until finished. 11/29/23  Yes Silver Fell A, PA  hydrALAZINE  (APRESOLINE ) 10 MG tablet Take 1 tablet (10 mg total) by mouth every 8 (eight) hours as needed (Systolic BP readings greater than 165 systolic). 11/28/23  Yes Silver Fell A, PA  acetaminophen  (TYLENOL ) 500 MG tablet Take 500 mg by mouth every 6 (six) hours as  needed for mild pain or moderate pain.    [provider]  Ascorbic Acid (VITAMIN C) 500 MG CAPS Vitamin C    [provider]  cetirizine (ZYRTEC) 10 MG tablet  11/06/18   [provider]  cholecalciferol (VITAMIN D) 1000 UNITS tablet Take 2,000 Units by mouth daily.     [provider]  dorzolamide-timolol (COSOPT) 22.3-6.8 MG/ML ophthalmic solution INSTILL 1 DROP INTO RIGHT EYE TWICE A DAY 07/07/19   [provider]  fish oil-omega-3 fatty acids 1000 MG capsule Take 2 g by mouth daily.    [provider]  GARLIC PO Take by mouth daily in the afternoon.    [provider]  losartan-hydrochlorothiazide  (HYZAAR) 100-12.5 MG tablet Take 1 tablet by mouth daily. 08/07/23   [provider]  Magnesium 250 MG TABS Take by mouth.    [provider]  nitroGLYCERIN  (NITROSTAT ) 0.4 MG SL tablet Place 1 tablet (0.4 mg total) under the tongue every 5 (five) minutes as needed for up to 30 doses for chest pain. Up to 2 doses in a row 04/19/21   Trifan, Donnice PARAS, MD  vitamin E 1000 UNIT capsule Take 1,000 Units by mouth daily.    [provider]      Allergies    Aspirin and Sulfa antibiotics    Review of Systems   Review of Systems  Respiratory:  Positive for cough.   All other systems reviewed and are negative.   Physical Exam Updated Vital Signs BP 123/80   Pulse 74   Temp  98.8 F (37.1 C) (Oral)   Resp 20   Ht 5' 3 (1.6 m)   Wt 97.1 kg   SpO2 99%   BMI 37.91 kg/m  Physical Exam Vitals and nursing note reviewed.  Constitutional:      General: She is not in acute distress.    Appearance: She is well-developed.  HENT:     Head: Normocephalic and atraumatic.     Nose: No congestion or rhinorrhea.     Mouth/Throat:     Mouth: Mucous membranes are moist.     Pharynx: Oropharynx is clear.  Eyes:     Conjunctiva/sclera: Conjunctivae normal.  Cardiovascular:     Rate and Rhythm: Normal rate and  regular rhythm.  Pulmonary:     Effort: Pulmonary effort is normal. No respiratory distress.     Breath sounds: No wheezing or rhonchi.     Comments: Faint Rales auscultated left lower lung field. Abdominal:     Palpations: Abdomen is soft.     Tenderness: There is no abdominal tenderness. There is no guarding.  Musculoskeletal:        General: No swelling.     Cervical back: Neck supple.     Right lower leg: No edema.     Left lower leg: No edema.  Skin:    General: Skin is warm and dry.     Capillary Refill: Capillary refill takes less than 2 seconds.  Neurological:     Mental Status: She is alert.  Psychiatric:        Mood and Affect: Mood normal.     ED Results / Procedures / Treatments   Labs (all labs ordered are listed, but only abnormal results are displayed) Labs Reviewed  RESP PANEL BY RT-PCR (RSV, FLU A&B, COVID)  RVPGX2 - Abnormal; Notable for the following components:      Result Value   Resp Syncytial Virus by PCR POSITIVE (*)    All other components within normal limits  BASIC METABOLIC PANEL - Abnormal; Notable for the following components:   Creatinine, Ser 1.14 (*)    GFR, Estimated 51 (*)    All other components within normal limits  CBC    EKG None  Radiology DG Chest 2 View Result Date: 11/28/2023 CLINICAL DATA:  One-week history of cough and congestion EXAM: CHEST - 2 VIEW COMPARISON:  Chest radiograph dated 04/19/2021 FINDINGS: Normal lung volumes. Minimal patchy left basilar opacity. No pleural effusion or pneumothorax. The heart size and mediastinal contours are within normal limits. No acute osseous abnormality. IMPRESSION: Minimal patchy left basilar opacity, which may represent atelectasis or bronchopneumonia. Electronically Signed   By: Limin  Xu M.D.   On: 11/28/2023 19:31    Procedures Procedures    Medications Ordered in ED Medications  hydrALAZINE  (APRESOLINE ) injection 10 mg (10 mg Intravenous Given 11/28/23 2223)  cefTRIAXone   (ROCEPHIN ) 1 g in sodium chloride  0.9 % 100 mL IVPB (0 g Intravenous Stopped 11/28/23 2303)  azithromycin  (ZITHROMAX ) 500 mg in sodium chloride  0.9 % 250 mL IVPB (500 mg Intravenous New Bag/Given 11/28/23 2303)    ED Course/ Medical Decision Making/ A&P                                 Medical Decision Making Amount and/or Complexity of Data Reviewed Labs: ordered. Radiology: ordered.  Risk Prescription drug management.   This patient presents to the ED for concern of cough, this involves  an extensive number of treatment options, and is a complaint that carries with it a high risk of complications and morbidity.  The differential diagnosis includes viral URI, COVID, flu, RSV, pneumonia, GERD, asthma, malignancy, other   Co morbidities that complicate the patient evaluation  See HPI   Additional history obtained:  Additional history obtained from EMR External records from outside source obtained and reviewed including hospital records   Lab Tests:  I Ordered, and personally interpreted labs.  The pertinent results include: Respiratory viral panel is positive for RSV.  No leukocytosis.  No evidence of anemia.  Platelets within normal range.  No electrolyte abnormalities.  Slight elevation of creatinine 1.14 from baseline around 1.0.   Imaging Studies ordered:  I ordered imaging studies including chest x-ray I independently visualized and interpreted imaging which showed left lower lobe opacity I agree with the radiologist interpretation   Cardiac Monitoring: / EKG:  The patient was maintained on a cardiac monitor.  I personally viewed and interpreted the cardiac monitored which showed an underlying rhythm of: Sinus rhythm   Consultations Obtained:  N/a   Problem List / ED Course / Critical interventions / Medication management  Pneumonia, RSV I ordered medication including hydralazine , azithromycin , Rocephin    Reevaluation of the patient after these medicines showed  that the patient improved I have reviewed the patients home medicines and have made adjustments as needed   Social Determinants of Health:  Former cigarette use.  Denies illicit drug use.   Test / Admission - Considered:  Cough, pneumonia, RSV Vitals signs significant for hypertension with initial blood pressure of 199/102 of which decreased with time labs and medicines administered while in the emergency department to within normal limits. Otherwise within normal range and stable throughout visit. Laboratory/imaging studies significant for: See above 73 year old female presents emergency department with complaints of cough, congestion for the past 1 to 2 weeks.  Has been seen a couple different times for similar presentation without significant improvement of symptoms.  Has been trying multiple over-the-counter cough and cold medicines which have been helping a little bit per patient.  On exam, faint auscultatory Rales appreciated left lower lung field.  Patient initially pretty hypertensive with blood pressures in the 200s systolic despite being compliant with her antihypertensive medications.  Suspect that the over-the-counter cough and cold medicine is playing a part and elevating her blood pressure as she has not had problems maintaining her blood pressure prior to current illness and taking over-the-counter medications.  Treated with hydralazine  and noted improvement of blood pressure.  Will give a short course of hydralazine  to take as needed if blood pressure remains elevated but I do not suspect the patient will need prolonged course as this is most likely secondary to medication side effect.  Patient's workup otherwise reassuring.  Chest x-ray with concern for bronchopneumonia; treated with first dose of antibiotics IV while in the ED.SABRA  Patient also with viral testing positive for RSV.  Labs reassuring.  Will treat patient with antibiotics regarding pneumonia and recommend follow-up with  primary care in the outpatient setting.  Treatment plan discussed at length with patient and she acknowledged understanding was agreeable to said plan.  Patient overall well-appearing, afebrile, nonhypoxic in no acute distress. Worrisome signs and symptoms were discussed with the patient, and the patient acknowledged understanding to return to the ED if noticed. Patient was stable upon discharge.          Final Clinical Impression(s) / ED Diagnoses Final diagnoses:  Pneumonia of lower lobe due to infectious organism, unspecified laterality  RSV (acute bronchiolitis due to respiratory syncytial virus)  Hypertension, unspecified type    Rx / DC Orders ED Discharge Orders          Ordered    amoxicillin -clavulanate (AUGMENTIN ) 875-125 MG tablet  Every 12 hours        11/28/23 2302    azithromycin  (ZITHROMAX ) 250 MG tablet  Daily        11/28/23 2302    hydrALAZINE  (APRESOLINE ) 10 MG tablet  Every 8 hours PRN        11/28/23 2305              Silver Wonda LABOR, [place] 11/29/23 0006    Jerral Meth, MD 11/29/23 [phone]

## 2023-12-05 DIAGNOSIS — G4733 Obstructive sleep apnea (adult) (pediatric): Secondary | ICD-10-CM | POA: Diagnosis not present

## 2023-12-06 ENCOUNTER — Telehealth: Payer: Self-pay

## 2023-12-06 NOTE — Progress Notes (Signed)
 Transition Care Management Unsuccessful Follow-up Telephone Call  Date of discharge and from where:  11/29/2023 [facility]  Attempts:  1st Attempt  Reason for unsuccessful TCM follow-up call:  Left voice message  Byrd Terrero [facility]  [facility] Guide Direct Dial: [phone]  Website: [email or web address]

## 2023-12-27 ENCOUNTER — Encounter: Payer: Self-pay | Admitting: Internal Medicine

## 2023-12-27 ENCOUNTER — Telehealth: Payer: Medicare PPO | Admitting: Internal Medicine

## 2023-12-27 ENCOUNTER — Ambulatory Visit: Payer: Medicare PPO | Attending: Internal Medicine | Admitting: Internal Medicine

## 2023-12-27 VITALS — BP 140/90 | HR 69 | Ht 63.0 in | Wt 212.8 lb

## 2023-12-27 DIAGNOSIS — Z79899 Other long term (current) drug therapy: Secondary | ICD-10-CM

## 2023-12-27 DIAGNOSIS — I10 Essential (primary) hypertension: Secondary | ICD-10-CM

## 2023-12-27 DIAGNOSIS — I8002 Phlebitis and thrombophlebitis of superficial vessels of left lower extremity: Secondary | ICD-10-CM

## 2023-12-27 DIAGNOSIS — E7800 Pure hypercholesterolemia, unspecified: Secondary | ICD-10-CM | POA: Diagnosis not present

## 2023-12-27 DIAGNOSIS — R55 Syncope and collapse: Secondary | ICD-10-CM | POA: Diagnosis not present

## 2023-12-27 NOTE — Addendum Note (Signed)
Addended by: Lamar Benes on: 12/27/2023 04:20 PM   Modules accepted: Orders

## 2023-12-27 NOTE — Progress Notes (Signed)
OFFICE NOTE  Chief Complaint:  Follow-up  Primary Care Physician: Irena Reichmann, DO  HPI:  [name] is a 73 year old female with a history of hypertension, dyslipidemia, obstructive sleep apnea on CPAP and palpitations. Recently, she reported her palpitations have improved, and we discussed in the past about possibly starting low-dose atenolol. However, she never had that prescription filled. One thing she pointed out is that she has been drinking Aqua Blue distilled water for some period of time. It is actually interesting because that water contains no minerals, and she may get very few minerals in her diet. Recently, she started taking mineral supplements, and she has reported that her palpitations have improved, suggesting it may be related to a mineral deficiency. Overall, she sleeps well with her CPAP and feels that that is working well for her until recently, when her machine broke. She has been using her cousin's. She also has dyslipidemia and her cholesterol is high on recent blood draw. This may be due to recent weight gain.  07/29/2020  [name] is seen today to reestablish care.  I last saw her in 2014 and therefore she is considered a new patient.  As above past medical history significant for hypertension, dyslipidemia, OSA on CPAP and some palpitations.  More recently she has had some palpitations however generally feels like they are well controlled.  EKG shows a sinus rhythm with first-degree AV block at 70.  Blood pressure is better today 134/76.  Recent lipids in May showed high LDL cholesterol 192 with total 258 and HDL 55 triglycerides 68.  Hemoglobin A1c was 5.3%.  The high cholesterol had been previously noted, however she is on no therapy for this.  Is not clear if she has had statin intolerance in the past.  11/03/2020  [name] returns today for follow-up.  She underwent calcium scoring due to significantly elevated cholesterol.  This demonstrated a  high coronary calcium score 466, 95th percentile for age and sex matched control.  Calcium was noted in the left main, proximal LAD and circumflex arteries.  There was an incidental finding of a 7 mm pulmonary nodule in the right lower lobe which was recommended to be followed up in 6 to 12 months.  We have ordered a 39-month CT chest nodule follow-up.  She is aware of this finding.  After some discussion I had recommended starting on high potency atorvastatin.  She did not start the medication when we recommended it in November rather wanted to wait until this appointment to discuss it further.  She had a number of concerns about statins, including the potential risk for developing diabetes, the fact that she might have significant permanent disability associated with it, and that it could deplete the body of coenzyme Q 10 which she would need to take to replace that.  I addressed all of these concerns which are typically poorly understood and not scientifically based Internet fodder.  She seemed reassured and understood the importance of statin therapy with regards to significant risk reduction given her extensive coronary disease.  In addition she is interested in starting an exercise program.  It is difficult at this point since she is fairly sedentary to understand whether or not she has any obstructive coronary disease.  We discussed the possibility of stress testing to determine her risk prior to starting an intense exercise program and per current American Society of nuclear cardiology guidelines given her calcium score greater than 400, Myoview stress testing is considered  appropriate.  11/24/2021  [name] is seen today in follow-up.  Recently she called in the office because of chest fluttering as well as sharp intermittent chest pains.  She notes that sometimes when taking her trash out but not all the time.  She says she gets discomfort that comes and goes.  She was seen in urgent care for this  but work-up was negative.  She also reports intermittent fluttering in her chest that happens maybe a few times a week.  She had Myoview stress testing last year because of a high calcium score which was negative for ischemia.  01/05/2022  [name] seen today for follow-up of chest pain and chest fluttering.  She reports her chest fluttering and pain have resolved.  She was noted to have a short episode of SVT on the monitor but she had then made dietary changes including stopping caffeine and noticed that her symptoms have resolved.  Blood pressure was elevated today however she says home blood pressures are better controlled.  We did repeat a CT scan for follow-up of a nodule and that was unchanged, suggesting that the nodules are benign.  12/27/2023  [name] seen today in follow-up.  She has noted recently some issues with her blood pressure.  This has been quite labile and at times has gone up significantly.  She had a syncopal episode in the fall more for which we placed a 2-week monitor which was normal.  An echocardiogram was performed which showed normal LVEF and mild LVH.  Fortunately she has not reported any further episodes.  Blood pressures at home have ranged between 120-130 systolic over 75-80.  She has been experiencing some pain in her left calf.  She has a history of varicose veins and notes that they are enlarged and tender to touch.  She denies any worsening shortness of breath or chest pain.  She has not noted any significant worsening edema.  We did review her lipids.  Back in May her cholesterol was very high with total 263 and LDL 198.  She is not on any therapies for that although we have discussed treatment and she has made lifestyle and dietary changes over the last 6 months.  PMHx:  Past Medical History:  Diagnosis Date   Arthritis    Blood transfusion without reported diagnosis    Cataract    Diverticulitis    Fibroid    GERD (gastroesophageal reflux disease)     occ   Glaucoma    Heart murmur    History of echocardiogram 10/16/2011   EF >55%; borderline concentric LVH; mild MR, mild TR   History of palpitations    Hyperlipidemia    Hypertension    OSA on CPAP     Past Surgical History:  Procedure Laterality Date   ABDOMINAL HYSTERECTOMY  1991   TAH,BSO   BREAST SURGERY     REDUCTION MAMMAPLASTY   EYE SURGERY  2010   EYE SURGERY     shunt due to glaucoma   HERNIA REPAIR     INSERTION OF AHMED VALVE Left 05/04/2015   Procedure: REVISION AHMED VALVE LEFT EYE WITH TUTOPLAST AND AMNIOGRAFT;  Surgeon: Chalmers Guest, MD;  Location: [facility] OR;  Service: Ophthalmology;  Laterality: Left;   JOINT REPLACEMENT     OOPHORECTOMY     BSO   REPLACEMENT TOTAL KNEE Right 2008   TONSILLECTOMY      FAMHx:  Family History  Problem Relation Age of Onset   Cancer  Mother        Bladder cancer   Hypertension Brother    COPD Brother    Hypertension Sister    Breast cancer Sister        Age 34   Cancer Sister        Bladder cancer   Stroke Maternal Grandmother    Colon cancer Neg Hx     SOCHx:   reports that she quit smoking about 43 years ago. Her smoking use included cigarettes. She has never used smokeless tobacco. She reports that she does not drink alcohol and does not use drugs.  ALLERGIES:  Allergies  Allergen Reactions   Aspirin     Can not take Aspirin and Sulfa in combination   Sulfa Antibiotics Hives    ROS: Pertinent items noted in HPI and remainder of comprehensive ROS otherwise negative.  HOME MEDS: Current Outpatient Medications  Medication Sig Dispense Refill   acetaminophen (TYLENOL) 500 MG tablet Take 500 mg by mouth every 6 (six) hours as needed for mild pain or moderate pain.     cholecalciferol (VITAMIN D) 1000 UNITS tablet Take 2,000 Units by mouth daily.      dorzolamide-timolol (COSOPT) 22.3-6.8 MG/ML ophthalmic solution INSTILL 1 DROP INTO RIGHT EYE TWICE A DAY     fish oil-omega-3 fatty acids 1000 MG capsule Take  2 g by mouth daily.     losartan-hydrochlorothiazide (HYZAAR) 100-12.5 MG tablet Take 1 tablet by mouth daily.     Magnesium 250 MG TABS Take by mouth.     vitamin E 1000 UNIT capsule Take 1,000 Units by mouth daily.     hydrALAZINE (APRESOLINE) 10 MG tablet Take 1 tablet (10 mg total) by mouth every 8 (eight) hours as needed (Systolic BP readings greater than 165 systolic). (Patient not taking: Reported on 12/27/2023) 30 tablet 0   No current facility-administered medications for this visit.    LABS/IMAGING: No results found for this or any previous visit (from the past 48 hours). No results found.  VITALS: BP (!) 140/90 (Cuff Size: Large)   Pulse 69   Ht 5\' 3"  (1.6 m)   Wt 212 lb 12.8 oz (96.5 kg)   SpO2 100%   BMI 37.70 kg/m   EXAM: General appearance: alert, no distress, and morbidly obese Neck: no carotid bruit, no JVD, and thyroid not enlarged, symmetric, no tenderness/mass/nodules Lungs: diminished breath sounds bibasilar Heart: regular rate and rhythm Abdomen: soft, non-tender; bowel sounds normal; no masses,  no organomegaly and obese Extremities: extremities normal, atraumatic, no cyanosis or edema, varicose veins noted, and tenderness and enlargement of the superficial veins of the left calf Pulses: 2+ and symmetric Skin: Skin color, texture, turgor normal. 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   [name] underwent  workup for syncopal episode but had a normal monitor and echocardiogram.  Blood pressure appears to be reasonably well-controlled at home.  She is complaining today of some tenderness and enlargement of the veins in her left calf which is likely consistent with superficial thrombophlebitis.  I could not rule out underlying DVT although the leg diameters are similar and there is no significant increase in warmth in the leg.  I advised starting low-dose aspirin and applying hot compresses several times a day.  Will arrange for ultrasound of the leg but could not do that today and will be performed on Monday.  She was advised ER precautions in case she develops shortness of breath or acute onset chest pain.  That would only be if she had underlying DVT however again I suspect that this is superficial thrombophlebitis which is a low risk of embolization.  She needs repeat lipid testing.  Her LDL was significantly elevated and she is not on any therapies for this.  She has made dietary changes and lost weight recently.  She has aged advanced coronary artery disease by her calcium score in 2021.  She does need aggressive therapy but has not been on a statin or amenable to treatment in the past.  She denies any anginal symptoms however given her high calcium score we have to have a low threshold to consider an ischemia evaluation as well.  Plan follow-up with [place] in 3 months.  [name], MD, [facility], FACP    [facility]  Medical Director of the [facility]  Cardiovascular Risk Reduction Clinic Diplomate of the American Board of Clinical Lipidology Attending Cardiologist  Direct Dial: [phone]  Fax: [phone]  Website:  [email or web address]   [name] 12/27/2023, 2:54 PM

## 2023-12-27 NOTE — Patient Instructions (Addendum)
Medication Instructions:  Start Daily Asprin 81 mg as directed.  Your physician recommends that you continue on your current medications as directed. Please refer to the Current Medication list given to you today.  *If you need a refill on your cardiac medications before your next appointment, please call your pharmacy*   Lab Work: NMR at your convenience   Testing/Procedures: Your physician has requested that you have a lower extremity arterial duplex. This test is an ultrasound of the arteries in the legs or arms. It looks at arterial blood flow in the legs and arms. Allow one hour for Lower and Upper Arterial scans. There are no restrictions or special instructions.  Please note: We ask at that you not bring children with you during ultrasound (echo/ vascular) testing. Due to room size and safety concerns, children are not allowed in the ultrasound rooms during exams. Our front office staff cannot provide observation of children in our lobby area while testing is being conducted. An adult accompanying a patient to their appointment will only be allowed in the ultrasound room at the discretion of the ultrasound technician under special circumstances. We apologize for any inconvenience.    Follow-Up: At [facility], you and your health needs are our priority.  As part of our continuing mission to provide you with exceptional heart care, we have created designated Provider Care Teams.  These Care Teams include your primary Cardiologist (physician) and Advanced Practice Providers (APPs -  Physician Assistants and Nurse Practitioners) who all work together to provide you with the care you need, when you need it.  We recommend signing up for the patient portal called "MyChart".  Sign up information is provided on this After Visit Summary.  MyChart is used to connect with patients for Virtual Visits (Telemedicine).  Patients are able to view lab/test results, encounter notes, upcoming  appointments, etc.  Non-urgent messages can be sent to your provider as well.   To learn more about what you can do with MyChart, go to [email or web address].    Your next appointment:   3 month(s)  Provider:   APP    Other Instructions

## 2023-12-30 ENCOUNTER — Ambulatory Visit (HOSPITAL_COMMUNITY)
Admission: RE | Admit: 2023-12-30 | Discharge: 2023-12-30 | Disposition: A | Discharge status: Home / Self Care | Payer: Medicare PPO | Source: Ambulatory Visit | Attending: Cardiovascular Disease | Admitting: Cardiovascular Disease

## 2023-12-30 DIAGNOSIS — R55 Syncope and collapse: Secondary | ICD-10-CM | POA: Diagnosis not present

## 2023-12-30 DIAGNOSIS — I8002 Phlebitis and thrombophlebitis of superficial vessels of left lower extremity: Secondary | ICD-10-CM | POA: Insufficient documentation

## 2023-12-30 DIAGNOSIS — Z79899 Other long term (current) drug therapy: Secondary | ICD-10-CM | POA: Diagnosis not present

## 2023-12-30 DIAGNOSIS — I10 Essential (primary) hypertension: Secondary | ICD-10-CM | POA: Diagnosis not present

## 2023-12-30 DIAGNOSIS — E7800 Pure hypercholesterolemia, unspecified: Secondary | ICD-10-CM | POA: Diagnosis not present

## 2023-12-31 LAB — NMR, LIPOPROFILE
Cholesterol, Total: 274 mg/dL — ABNORMAL HIGH (ref 100–199)
HDL Particle Number: 25.9 umol/L — ABNORMAL LOW (ref >=30.5)
HDL-C: 56 mg/dL (ref >39)
LDL Particle Number: 2409 nmol/L — ABNORMAL HIGH (ref <1000)
LDL Size: 21.4 nmol (ref >20.5)
LDL-C (NIH Calc): 209 mg/dL — ABNORMAL HIGH (ref 0–99)
LP-IR Score: <25 (ref <=45)
Small LDL Particle Number: 818 nmol/L — ABNORMAL HIGH (ref <=527)
Triglycerides: 62 mg/dL (ref 0–149)

## 2023-12-31 LAB — LIPOPROTEIN A (LPA): Lipoprotein (a): 285.5 nmol/L — ABNORMAL HIGH (ref <75.0)

## 2024-02-04 ENCOUNTER — Other Ambulatory Visit: Payer: Self-pay | Admitting: *Deleted

## 2024-02-04 ENCOUNTER — Telehealth: Payer: Self-pay | Admitting: Internal Medicine

## 2024-02-04 DIAGNOSIS — E7800 Pure hypercholesterolemia, unspecified: Secondary | ICD-10-CM

## 2024-02-04 MED ORDER — ROSUVASTATIN CALCIUM 20 MG PO TABS: 20.0000 mg | ORAL_TABLET | Freq: Every day | ORAL | 3 refills | Status: DC

## 2024-02-04 NOTE — Telephone Encounter (Signed)
 Patient identification verified by 2 forms. Shade Flood, RN     Called and spoke to patient. Called patient. Patient made aware of the lab results. Patient verbalized understanding. Patient agrees with plan, no questions at this time    Lindell Spar, RN 02/04/2024 10:53 AM EDT Back to Top    Left message that Rx will be sent and lab slips mailed. Call back or message thru MyChart w/questions or concerns.   Lindell Spar, RN 01/15/2024 11:50 AM EST     Left message to call back to discuss results   Lindell Spar, RN 01/07/2024 10:43 AM EST     Results sent to MyChart   [name], MD 01/07/2024 10:31 AM EST     LP(a) is high - Cholesterol is very high. Continue low dose aspirin.  Would advise starting statin therapy - Rosuvastatin 20 mg daily -repeat lipid NMR in 3-4 months.   Dr Rexene Edison

## 2024-02-04 NOTE — Telephone Encounter (Signed)
 Patient returned RN's call regarding medication.

## 2024-02-17 ENCOUNTER — Telehealth: Payer: Self-pay | Admitting: Family Medicine

## 2024-02-17 NOTE — Telephone Encounter (Signed)
 LVM and sent mychart msg informing pt of need to reschedule 07/02/24 appt - NP out

## 2024-03-05 DIAGNOSIS — G4733 Obstructive sleep apnea (adult) (pediatric): Secondary | ICD-10-CM | POA: Diagnosis not present

## 2024-05-31 DIAGNOSIS — L309 Dermatitis, unspecified: Secondary | ICD-10-CM | POA: Diagnosis not present

## 2024-07-02 ENCOUNTER — Ambulatory Visit: Payer: Medicare PPO | Admitting: Family Medicine

## 2024-07-07 DIAGNOSIS — H401133 Primary open-angle glaucoma, bilateral, severe stage: Secondary | ICD-10-CM | POA: Diagnosis not present

## 2024-07-17 DIAGNOSIS — M1712 Unilateral primary osteoarthritis, left knee: Secondary | ICD-10-CM | POA: Diagnosis not present

## 2024-07-23 DIAGNOSIS — G4733 Obstructive sleep apnea (adult) (pediatric): Secondary | ICD-10-CM | POA: Diagnosis not present

## 2024-08-01 DIAGNOSIS — Z1231 Encounter for screening mammogram for malignant neoplasm of breast: Secondary | ICD-10-CM | POA: Diagnosis not present

## 2024-08-04 DIAGNOSIS — E7801 Familial hypercholesterolemia: Secondary | ICD-10-CM | POA: Diagnosis not present

## 2024-08-04 DIAGNOSIS — N1831 Chronic kidney disease, stage 3a: Secondary | ICD-10-CM | POA: Diagnosis not present

## 2024-08-04 DIAGNOSIS — I129 Hypertensive chronic kidney disease with stage 1 through stage 4 chronic kidney disease, or unspecified chronic kidney disease: Secondary | ICD-10-CM | POA: Diagnosis not present

## 2024-08-04 DIAGNOSIS — R7309 Other abnormal glucose: Secondary | ICD-10-CM | POA: Diagnosis not present

## 2024-08-11 DIAGNOSIS — N951 Menopausal and female climacteric states: Secondary | ICD-10-CM | POA: Diagnosis not present

## 2024-08-11 DIAGNOSIS — R7309 Other abnormal glucose: Secondary | ICD-10-CM | POA: Diagnosis not present

## 2024-08-11 DIAGNOSIS — N1831 Chronic kidney disease, stage 3a: Secondary | ICD-10-CM | POA: Diagnosis not present

## 2024-08-11 DIAGNOSIS — M5134 Other intervertebral disc degeneration, thoracic region: Secondary | ICD-10-CM | POA: Diagnosis not present

## 2024-08-11 DIAGNOSIS — R946 Abnormal results of thyroid function studies: Secondary | ICD-10-CM | POA: Diagnosis not present

## 2024-08-11 DIAGNOSIS — E7800 Pure hypercholesterolemia, unspecified: Secondary | ICD-10-CM | POA: Diagnosis not present

## 2024-08-11 DIAGNOSIS — I129 Hypertensive chronic kidney disease with stage 1 through stage 4 chronic kidney disease, or unspecified chronic kidney disease: Secondary | ICD-10-CM | POA: Diagnosis not present

## 2024-08-11 DIAGNOSIS — I2584 Coronary atherosclerosis due to calcified coronary lesion: Secondary | ICD-10-CM | POA: Diagnosis not present

## 2024-08-11 DIAGNOSIS — Z0000 Encounter for general adult medical examination without abnormal findings: Secondary | ICD-10-CM | POA: Diagnosis not present

## 2024-08-13 DIAGNOSIS — R928 Other abnormal and inconclusive findings on diagnostic imaging of breast: Secondary | ICD-10-CM | POA: Diagnosis not present

## 2024-09-28 NOTE — Progress Notes (Unsigned)
 PATIENT: [name] DOB: 10-Apr-1951  REASON FOR VISIT: follow up HISTORY FROM: patient  No chief complaint on file.    HISTORY OF PRESENT ILLNESS:  09/28/24 ALL: [name] returns for follow up for OSA on CPAP. She continues to do well on therapy. She Is using CPAP nightly for about   She is eligible for a new machine.      07/03/2023 ALL:  [name] returns for follow up for OSA on CPAP. She was last seen 2021. She reports doing well. She is using therapy nightly for about 8-9 hours, on average. She denies concerns with machine or supplies. She is using a nasal pillow style mask. She admits that she does not change it out as often as she should. She is sleeping well and without concerns, today. She is followed annually by PCP.     07/25/2020 ALL: [name] is a 73 y.o. female here today for follow up for OSA on CPAP. She is doing very well. She reports that she uses her machine every night. She was caring for her aunt for 2 weeks and was not able to use her machine due to getting up and down all night. Otherwise, she has been exceptionally compliant. She does feel better rested and more energized on therapy. She is working part time in [employer] at an [employer].   Compliance report dated 06/24/2020 through 07/23/2020 reveals that she used CPAP 23 of the past 30 days for compliance of 77%.  She used CPAP greater than 4 hours all 23 of the past 30 days, again for compliance of 77%.  Average usage on days used was 8 hours and 38 minutes.  Residual AHI was 0.6 on 6 to 16 cm of water  pressure and an EPR of 3.  There was no significant leak noted.   HISTORY: (copied from Dr Dohmeier's note on 07/22/2019)  HPI: [name] is a 73 y.o. female patient  Who last had a telephone visit with Greig Forbes , NP on 04-21-2019, at a time she was not yet set up her new CPAP.  She had undergone a HST on 02-02-2019.   :STUDY RESULTS:  Total Recording Time: 9 h,1 min; Total Sleep  Time: 7 h 29 min. Total Apnea/Hypopnea Index (AHI): 32.8 /h; RDI: 33.2 /h; REM AHI:  49.0/h. Average Oxygen Saturation:  95%; Lowest Oxygen Desaturation: 87 %.  Total Time Oxygen Saturation below 89 %: 0.2 minutes.  Average Heart Rate: 61 bpm (between 54 and 78 bpm). IMPRESSION:  Severe OSA at AHI of 32.8/h with some REM accentuation to 49/h. No clinically significant hypoxemia.  Regular heart rate.  RECOMMENDATION: The patient requested a new mask to be fitted, she will continue CPAP therapy with an autotitration machine, heated humidity and a setting from 6-16 cm water , 1 cm EPR.  Dedra Gores, M.D.  02-10-2019    follow up on CPAP today- [name] presents today with excellent compliance data.  She has used her CPAP 100% of the last 30 days by days and by hours the last night recorded was 19 July 2019.  Her AutoSet CPAP is between 6 and 16 cm water  pressure with 3 cm EPR, her residual AHI is 0.5/h.  This is an excellent resolution with no central apneas arising.   The 95th percentile pressure was 10.4 cmH2O and felt well within the currently prescribed pressure window. Based on her compliance data I am extremely happy.     [name]  is a 73 y.o. female patient seen on 01-19-2019 upon a referral  from Dr. Gerome , MD / Dr. Gerome, DO for a sleep evaluation.  She is a known OSA patient and currently on her second CPAP, which she uses daily. [name] also has a history of hypertension, she is at this time considered morbidly obese as her body mass index is approached 75, she has high cholesterol, glaucoma, she has an abdominal umbilical hernia, breast reduction surgery surgery, hysterectomy and several eye surgeries.  Also listed are chronic kidney disease but no grade was named, hypertension and allergic rhinitis.   I reviewed the current medication,   Chief complaint according to patient :  I know I have to sleep with a CPAP but I never had a new machine in over 10  years.   Sleep habits are as follows:  Dinner time is 5 PM and sleep time is 10.00 PM.  Bedroom is cool, quiet and dark. She sleeps supine, on one pillow, in a flat bed. She dreams, but is not known to act out.  She does not snore while on CPAP, has no dry mouth, but nocturia times 2-3 times. Wakes up at 6.30 and rises- refreshed and restored. She is using nasal pillows, a bella swift with ear loops.    Sleep medical history: see above    Family sleep history: unknown.   Social history: lives alone, childless. The patient is retired but she still has a part-time job, she raised her nephew, her husband died in 53.  She is a former smoker but quit in 1982, she does not drink alcohol, she drinks coffee but mixes it 50-50 with decaf.   REVIEW OF SYSTEMS: Out of a complete 14 system review of symptoms, the patient complains only of the following symptoms, none and all other reviewed systems are negative.  ESS: 2 FSS: 19  ALLERGIES: Allergies  Allergen Reactions   Aspirin     Can not take Aspirin and Sulfa in combination   Sulfa Antibiotics Hives    HOME MEDICATIONS: Outpatient Medications Prior to Visit  Medication Sig Dispense Refill   acetaminophen  (TYLENOL ) 500 MG tablet Take 500 mg by mouth every 6 (six) hours as needed for mild pain or moderate pain.     aspirin EC 81 MG tablet Take 81 mg by mouth daily. Swallow whole.     cholecalciferol (VITAMIN D) 1000 UNITS tablet Take 2,000 Units by mouth daily.      dorzolamide-timolol (COSOPT) 22.3-6.8 MG/ML ophthalmic solution INSTILL 1 DROP INTO RIGHT EYE TWICE A DAY     fish oil-omega-3 fatty acids 1000 MG capsule Take 2 g by mouth daily.     hydrALAZINE  (APRESOLINE ) 10 MG tablet Take 1 tablet (10 mg total) by mouth every 8 (eight) hours as needed (Systolic BP readings greater than 165 systolic). (Patient not taking: Reported on 12/27/2023) 30 tablet 0   losartan-hydrochlorothiazide  (HYZAAR) 100-12.5 MG tablet Take 1 tablet by mouth  daily.     Magnesium 250 MG TABS Take by mouth.     rosuvastatin  (CRESTOR ) 20 MG tablet Take 1 tablet (20 mg total) by mouth daily. 90 tablet 3   vitamin E 1000 UNIT capsule Take 1,000 Units by mouth daily.     No facility-administered medications prior to visit.    PAST MEDICAL HISTORY: Past Medical History:  Diagnosis Date   Arthritis    Blood transfusion without reported diagnosis    Cataract    Diverticulitis  Fibroid    GERD (gastroesophageal reflux disease)    occ   Glaucoma    Heart murmur    History of echocardiogram 10/16/2011   EF >55%; borderline concentric LVH; mild MR, mild TR   History of palpitations    Hyperlipidemia    Hypertension    OSA on CPAP     PAST SURGICAL HISTORY: Past Surgical History:  Procedure Laterality Date   ABDOMINAL HYSTERECTOMY  1991   TAH,BSO   BREAST SURGERY     REDUCTION MAMMAPLASTY   EYE SURGERY  2010   EYE SURGERY     shunt due to glaucoma   HERNIA REPAIR     INSERTION OF AHMED VALVE Left 05/04/2015   Procedure: REVISION AHMED VALVE LEFT EYE WITH TUTOPLAST AND AMNIOGRAFT;  Surgeon: Gaither Quan, MD;  Location: [facility] OR;  Service: Ophthalmology;  Laterality: Left;   JOINT REPLACEMENT     OOPHORECTOMY     BSO   REPLACEMENT TOTAL KNEE Right 2008   TONSILLECTOMY      FAMILY HISTORY: Family History  Problem Relation Age of Onset   Cancer Mother        Bladder cancer   Hypertension Brother    COPD Brother    Hypertension Sister    Breast cancer Sister        Age 66   Cancer Sister        Bladder cancer   Stroke Maternal Grandmother    Colon cancer Neg Hx     SOCIAL HISTORY: Social History   Socioeconomic History   Marital status: Widowed    Spouse name: Not on file   Number of children: Not on file   Years of education: Not on file   Highest education level: Not on file  Occupational History   Not on file  Tobacco Use   Smoking status: Former    Current packs/day: 0.00    Types: Cigarettes    Quit date:  11/26/1980    Years since quitting: 43.8   Smokeless tobacco: Never  Vaping Use   Vaping status: Never Used  Substance and Sexual Activity   Alcohol use: No    Alcohol/week: 0.0 standard drinks of alcohol   Drug use: No   Sexual activity: Not Currently    Birth control/protection: Surgical    Comment: 1st intercourse 73 yo-Fewer than 5 partners  Other Topics Concern   Not on file  Social History Narrative   Not on file   Social Drivers of Health   Financial Resource Strain: Not on file  Food Insecurity: Not on file  Transportation Needs: No Transportation Needs (07/24/2023)   PRAPARE - Transportation    Lack of Transportation (Medical): No    Lack of Transportation (Non-Medical): No  Physical Activity: Not on file  Stress: Not on file  Social Connections: Not on file  Intimate Partner Violence: Not on file      PHYSICAL EXAM  There were no vitals filed for this visit.  There is no height or weight on file to calculate BMI.  Generalized: Well developed, in no acute distress  Cardiology: normal rate and rhythm, no murmur noted Respiratory: clear to auscultation bilaterally  Neurological examination  Mentation: Alert oriented to time, place, history taking. Follows all commands speech and language fluent Cranial nerve II-XII: Pupils were equal round reactive to light. Extraocular movements were full, visual field were full  Motor: The motor testing reveals 5 over 5 strength of all 4 extremities. Good symmetric motor  tone is noted throughout.  Gait and station: Gait is normal.    DIAGNOSTIC DATA (LABS, IMAGING, TESTING) - I reviewed patient records, labs, notes, testing and imaging myself where available.      No data to display           Lab Results  Component Value Date   WBC 5.8 11/28/2023   HGB 14.5 11/28/2023   HCT 44.2 11/28/2023   MCV 90.9 11/28/2023   PLT 208 11/28/2023      Component Value Date/Time   Chase 139 11/28/2023 2219   K 4.1 11/28/2023  2219   CL 101 11/28/2023 2219   CO2 28 11/28/2023 2219   GLUCOSE 94 11/28/2023 2219   BUN 17 11/28/2023 2219   CREATININE 1.14 (H) 11/28/2023 2219   CALCIUM  10.2 11/28/2023 2219   PROT 7.6 05/05/2013 1603   ALBUMIN 3.9 05/05/2013 1603   AST 22 05/05/2013 1603   ALT 18 05/05/2013 1603   ALKPHOS 100 05/05/2013 1603   BILITOT 0.9 05/05/2013 1603   GFRNONAA 51 (L) 11/28/2023 2219   GFRAA >60 05/04/2015 0735   Lab Results  Component Value Date   CHOL 250 (H) 05/05/2013   HDL 41.50 05/05/2013   LDLDIRECT 197.1 05/05/2013   TRIG 100.0 05/05/2013   CHOLHDL 6 05/05/2013   No results found for: HGBA1C No results found for: VITAMINB12 Lab Results  Component Value Date   TSH 1.11 05/05/2013      ASSESSMENT AND PLAN 73 y.o. year old female  has a past medical history of Arthritis, Blood transfusion without reported diagnosis, Cataract, Diverticulitis, Fibroid, GERD (gastroesophageal reflux disease), Glaucoma, Heart murmur, History of echocardiogram (10/16/2011), History of palpitations, Hyperlipidemia, Hypertension, and OSA on CPAP. here with   No diagnosis found.    Ms. Haslip is doing very well with CPAP therapy.  Compliance report reveals excellent compliance. She was encouraged to continue using CPAP nightly and for greater than 4 hours each night.  She will continue healthy lifestyle habits.  We will update supply orders today.  She will follow-up with me in 1 year, sooner if needed.  She verbalizes understanding and agreement with this plan.   No orders of the defined types were placed in this encounter.    No orders of the defined types were placed in this encounter.     Greig Forbes, FNP-C 09/28/2024, 1:05 PM [facility] [address], [address] [place], [place] [address] [phone]

## 2024-09-28 NOTE — Patient Instructions (Signed)

## 2024-09-29 NOTE — Progress Notes (Unsigned)
 [name]

## 2024-09-30 ENCOUNTER — Ambulatory Visit: Admitting: Family Medicine

## 2024-09-30 ENCOUNTER — Encounter: Payer: Self-pay | Admitting: Family Medicine

## 2024-09-30 VITALS — BP 139/76 | HR 69 | Ht 63.0 in | Wt 214.0 lb

## 2024-09-30 DIAGNOSIS — G4733 Obstructive sleep apnea (adult) (pediatric): Secondary | ICD-10-CM | POA: Diagnosis not present

## 2024-10-19 ENCOUNTER — Ambulatory Visit: Admitting: Neurology

## 2024-10-19 DIAGNOSIS — G4733 Obstructive sleep apnea (adult) (pediatric): Secondary | ICD-10-CM

## 2024-10-20 NOTE — Progress Notes (Signed)
 [facility]  Chakira PSABRA Na   HOME SLEEP TEST REPORT ( by Watch PAT)   STUDY DATE:  10-19-2024    ORDERING CLINICIAN:  Amy Lomax, NP  REFERRING CLINICIAN:     CLINICAL INFORMATION/HISTORY: CPAP issued in 2020, new machine needed, compliance is good., 6-16 cm water  3 cm EPR, AHI 0.6 /h Leaks 21 L /minute, 95% pressure was cm 9.8  cm water .  12/01/23 ALL: [name] returns for follow up for OSA on CPAP. She continues to do well on therapy. She Is using CPAP nightly for about 9 hours, on average. She is resting well. She denies concerns with machine or supplies. She denies any further altered awareness events. BP has been normal. It does run higher in the mornings. She is eligible for a new machine.     07/25/2020 ALL: [name] is a 73 y.o. female here today for follow up for OSA on CPAP. She is doing very well. She reports that she uses her machine every night. She was caring for her aunt for 2 weeks and was not able to use her machine due to getting up and down all night. Otherwise, she has been exceptionally compliant. She does feel better rested and more energized on therapy. She is working part time in [employer] at an [employer].         Epworth sleepiness score: X /24.   BMI: 37.9 kg/m   Neck Circumference: N/A   FINDINGS:   Sleep Summary:   Total Recording Time (hours, min):     9 h and 3 m   Total Sleep Time (hours, min):    8 h and 12 m             Percent REM (%):   28.5%                                      Respiratory Indices:   Calculated pAHI (per CMS guideline): 12.1/h                       REM pAHI:     30/h                                             NREM pAHI:    6./h                          Positional AHI:  supine AHI nearly 16/h     Snoring:     50 dB , loud and present throughout recorded sleep time                                           Oxygen Saturation Statistics:   Oxygen Saturation (%)  Mean:  95%          with an O2 Saturation Range (%):       68 - 100 %  O2 Saturation (minutes) <89%:    less than 1 minute        Pulse Rate Statistics:   Pulse Mean (bpm):   59 bpm               Pulse Range:      50 - 83 bpm           IMPRESSION:  This HST confirms the presence of  mild sleep apnea, all obstructive sleep apnea, (by AASM criteria she would be moderate severe) and should continue using CPAP therapy, avoiding supine sleep. SABRA    RECOMMENDATION:  Please order autotitration ResMed CPAP device  at same settings ( 6-16 cm, 3 cm EPR )  as current machine and refitting for a better sealing mask.    Any patient should be cautioned not to drive, work at heights, or operate dangerous or heavy equipment when tired or sleepy.   Review of good sleep hygiene measures is accessible to any sleep clinic patient and can be reiterated through online material- I we recommend the Guide to better Sleep   by the NIH.   Weight loss and Core Strength improvement is highly recommended for individuals with low muscle tone and/ or a BMI over 30.  Any CPAP patient should be reminded to be fully compliant with PAP therapy , (defined as using PAP therapy for more than 4 hours each night ) with the goal to improve sleep related symptoms and decrease long term cardiovascular risks. Any PAP therapy patient should be reminded, that it may take up to 3 months to get fully used to using PAP and it may take 1-2 weeks for an established CPAP user to acclimatize to changes in pressure or mask. The earlier full compliance is achieved, the better long term compliance tends to be.   Please note that untreated obstructive sleep apnea may carry additional perioperative morbidity. Patients with significant obstructive sleep apnea should receive perioperative PAP therapy and the surgical team should be informed of the diagnosis and degree of sleep disordered breathing.  Sleep fragmentation in  the presence of normal proportional sleep stages is a nonspecific findings and per se does not signify an intrinsic sleep disorder or a cause for the patient's sleep-related symptoms.  Causes include (but are not limited to) the unfamiliarity of sleeping while recorded by HST device or sleeping in a sleep lab for a full Polysomnography sleep study, but also circadian rhythm disturbances, medication side effects or an underlying mood disorder or medical problem.   The referring physician will be notified of the test results.       INTERPRETING PHYSICIAN:   Dedra Gores, MD  [facility] Sleep Board certified by The [employer] of Sleep Medicine and Diplomate of the [employer] of Sleep Medicine. Board certified In Neurology through the ABPN, Fellow of the [employer] of Neurology.

## 2024-10-21 ENCOUNTER — Ambulatory Visit: Admitting: Family Medicine

## 2024-10-21 DIAGNOSIS — G4733 Obstructive sleep apnea (adult) (pediatric): Secondary | ICD-10-CM | POA: Diagnosis not present

## 2024-10-29 DIAGNOSIS — H401133 Primary open-angle glaucoma, bilateral, severe stage: Secondary | ICD-10-CM | POA: Diagnosis not present

## 2024-10-29 DIAGNOSIS — H04123 Dry eye syndrome of bilateral lacrimal glands: Secondary | ICD-10-CM | POA: Diagnosis not present

## 2024-10-29 DIAGNOSIS — H47233 Glaucomatous optic atrophy, bilateral: Secondary | ICD-10-CM | POA: Diagnosis not present

## 2024-10-29 DIAGNOSIS — H1045 Other chronic allergic conjunctivitis: Secondary | ICD-10-CM | POA: Diagnosis not present

## 2024-11-01 ENCOUNTER — Other Ambulatory Visit: Payer: Self-pay | Admitting: Neurology

## 2024-11-01 DIAGNOSIS — G4733 Obstructive sleep apnea (adult) (pediatric): Secondary | ICD-10-CM

## 2024-11-01 NOTE — Procedures (Signed)
 [facility]  [name] Na   HOME SLEEP TEST REPORT ( by Watch PAT)   STUDY DATE:  10-19-2024    ORDERING CLINICIAN:  Amy Lomax, NP  REFERRING CLINICIAN:     CLINICAL INFORMATION/HISTORY: CPAP issued in 2020, new machine needed, compliance is good., 6-16 cm water  3 cm EPR, AHI 0.6 /h Leaks 21 L /minute, 95% pressure was cm 9.8  cm water .  12/01/23 ALL: [name] returns for follow up for OSA on CPAP. She continues to do well on therapy. She Is using CPAP nightly for about 9 hours, on average. She is resting well. She denies concerns with machine or supplies. She denies any further altered awareness events. BP has been normal. It does run higher in the mornings. She is eligible for a new machine.     07/25/2020 ALL: [name] is a 73 y.o. female here today for follow up for OSA on CPAP. She is doing very well. She reports that she uses her machine every night. She was caring for her aunt for 2 weeks and was not able to use her machine due to getting up and down all night. Otherwise, she has been exceptionally compliant. She does feel better rested and more energized on therapy. She is working part time in [employer] at an [employer].         Epworth sleepiness score: X /24.   BMI: 37.9 kg/m   Neck Circumference: N/A   FINDINGS:   Sleep Summary:   Total Recording Time (hours, min):     9 h and 3 m   Total Sleep Time (hours, min):    8 h and 12 m             Percent REM (%):   28.5%                                      Respiratory Indices:   Calculated pAHI (per CMS guideline): 12.1/h                       REM pAHI:     30/h                                             NREM pAHI:    6./h                          Positional AHI:  supine AHI nearly 16/h     Snoring:     50 dB , loud and present throughout recorded sleep time                                           Oxygen Saturation Statistics:   Oxygen Saturation (%) Mean:  95%           with an O2 Saturation Range (%):       68 - 100 %  O2 Saturation (minutes) <89%:    less than 1 minute        Pulse Rate Statistics:   Pulse Mean (bpm):   59 bpm               Pulse Range:      50 - 83 bpm           IMPRESSION:  This HST confirms the presence of  mild sleep apnea, all obstructive sleep apnea, (by AASM criteria she would be moderate severe) and should continue using CPAP therapy, avoiding supine sleep. SABRA    RECOMMENDATION:  Please order autotitration ResMed CPAP device  at same settings ( 6-16 cm, 3 cm EPR )  as current machine and refitting for a better sealing mask.    Any patient should be cautioned not to drive, work at heights, or operate dangerous or heavy equipment when tired or sleepy.   Review of good sleep hygiene measures is accessible to any sleep clinic patient and can be reiterated through online material- I we recommend the Guide to better Sleep   by the NIH.   Weight loss and Core Strength improvement is highly recommended for individuals with low muscle tone and/ or a BMI over 30.  Any CPAP patient should be reminded to be fully compliant with PAP therapy , (defined as using PAP therapy for more than 4 hours each night ) with the goal to improve sleep related symptoms and decrease long term cardiovascular risks. Any PAP therapy patient should be reminded, that it may take up to 3 months to get fully used to using PAP and it may take 1-2 weeks for an established CPAP user to acclimatize to changes in pressure or mask. The earlier full compliance is achieved, the better long term compliance tends to be.   Please note that untreated obstructive sleep apnea may carry additional perioperative morbidity. Patients with significant obstructive sleep apnea should receive perioperative PAP therapy and the surgical team should be informed of the diagnosis and degree of sleep disordered breathing.  Sleep fragmentation in the  presence of normal proportional sleep stages is a nonspecific findings and per se does not signify an intrinsic sleep disorder or a cause for the patient's sleep-related symptoms.  Causes include (but are not limited to) the unfamiliarity of sleeping while recorded by HST device or sleeping in a sleep lab for a full Polysomnography sleep study, but also circadian rhythm disturbances, medication side effects or an underlying mood disorder or medical problem.   The referring physician will be notified of the test results.       INTERPRETING PHYSICIAN:   Dedra Gores, MD  [facility] Sleep Board certified by The [employer] and Diplomate of the [employer] of [employer]. Board certified In Neurology through the ABPN, Fellow of the [employer] Neu[employer].

## 2024-11-02 ENCOUNTER — Ambulatory Visit: Payer: Self-pay | Admitting: Family Medicine

## 2024-12-30 ENCOUNTER — Ambulatory Visit: Admitting: Dermatology

## 2025-01-27 ENCOUNTER — Ambulatory Visit: Admitting: Dermatology
